# Patient Record
Sex: Male | Born: 1975
Health system: Southern US, Community
[De-identification: ages and names within clinical notes are randomized; demographics above are authoritative.]

## PROBLEM LIST (undated history)

## (undated) DIAGNOSIS — C801 Malignant (primary) neoplasm, unspecified: Secondary | ICD-10-CM

## (undated) DIAGNOSIS — T7840XA Allergy, unspecified, initial encounter: Secondary | ICD-10-CM

## (undated) HISTORY — PX: MYRINGOTOMY: SUR874

## (undated) HISTORY — DX: Allergy, unspecified, initial encounter: T78.40XA

## (undated) HISTORY — PX: INGUINAL HERNIA REPAIR: SHX194

## (undated) HISTORY — PX: TONSILLECTOMY: SUR1361

## (undated) HISTORY — DX: Malignant (primary) neoplasm, unspecified: C80.1

---

## 2006-04-05 ENCOUNTER — Emergency Department (HOSPITAL_COMMUNITY): Admission: EM | Admit: 2006-04-05 | Discharge: 2006-04-05 | Payer: Self-pay | Admitting: Family Medicine

## 2006-09-02 ENCOUNTER — Emergency Department (HOSPITAL_COMMUNITY): Admission: EM | Admit: 2006-09-02 | Discharge: 2006-09-02 | Payer: Self-pay | Admitting: Family Medicine

## 2007-01-06 ENCOUNTER — Emergency Department (HOSPITAL_COMMUNITY): Admission: EM | Admit: 2007-01-06 | Discharge: 2007-01-06 | Payer: Self-pay | Admitting: Emergency Medicine

## 2010-03-22 ENCOUNTER — Emergency Department (HOSPITAL_COMMUNITY)
Admission: EM | Admit: 2010-03-22 | Discharge: 2010-03-22 | Payer: Self-pay | Source: Home / Self Care | Admitting: Family Medicine

## 2010-03-30 ENCOUNTER — Emergency Department (HOSPITAL_COMMUNITY)
Admission: EM | Admit: 2010-03-30 | Discharge: 2010-03-30 | Payer: Self-pay | Source: Home / Self Care | Admitting: Family Medicine

## 2011-02-08 ENCOUNTER — Emergency Department (HOSPITAL_COMMUNITY)
Admission: EM | Admit: 2011-02-08 | Discharge: 2011-02-08 | Disposition: A | Discharge status: Home / Self Care | Payer: BC Managed Care – PPO | Source: Home / Self Care | Attending: Family Medicine | Admitting: Family Medicine

## 2011-02-08 DIAGNOSIS — J069 Acute upper respiratory infection, unspecified: Secondary | ICD-10-CM

## 2011-02-08 MED ORDER — GUAIFENESIN ER 600 MG PO TB12: 1200.0000 mg | ORAL_TABLET | Freq: Two times a day (BID) | ORAL | Status: AC

## 2011-02-08 MED ORDER — AZITHROMYCIN 250 MG PO TABS: 250.0000 mg | ORAL_TABLET | Freq: Every day | ORAL | Status: AC

## 2011-02-08 NOTE — ED Notes (Signed)
C/o fever, prod.  Cough of green sputum, runny/stuffy nose, body aches for 2 days.

## 2011-02-08 NOTE — ED Provider Notes (Signed)
History     CSN: [id number] Arrival date & time: 02/08/2011  8:14 AM   First MD Initiated Contact with Patient 02/08/11 [phone]      Chief Complaint  Patient presents with  . Fever  . Cough    (Consider location/radiation/quality/duration/timing/severity/associated sxs/prior treatment) Patient is a 35 y.o. male presenting with fever and cough. The history is provided by the patient.  Fever Primary symptoms of the febrile illness include fever and cough. Primary symptoms do not include wheezing. The current episode started 2 days ago. This is a new problem.  The fever has been unchanged since its onset. The maximum temperature recorded prior to his arrival was unknown.  The cough began 2 days ago. The cough is new. The cough is productive. The sputum is green.  Cough This is a new problem. The current episode started 2 days ago. The problem occurs constantly. The cough is productive of sputum. Pertinent negatives include no wheezing.  Green and thick.  History reviewed. No pertinent past medical history.  Past Surgical History  Procedure Date  . Inguinal hernia repair   . Tonsillectomy   . Myringotomy     History reviewed. No pertinent family history.  History  Substance Use Topics  . Smoking status: Never Smoker   . Smokeless tobacco: Not on file  . Alcohol Use: Yes     occasional      Review of Systems  Constitutional: Positive for fever.  HENT: Positive for congestion and postnasal drip.   Respiratory: Positive for cough. Negative for wheezing.   Cardiovascular: Negative.   Skin: Negative.     Allergies  Cough relief  Home Medications   Current Outpatient Rx  Name Route Sig Dispense Refill  . TYLENOL PO Oral Take by mouth as needed.      . AZITHROMYCIN 250 MG PO TABS Oral Take 1 tablet (250 mg total) by mouth daily. Take first 2 tablets together, then 1 every day until finished. 6 tablet 0  . GUAIFENESIN ER 600 MG PO TB12 Oral Take 2 tablets (1,200 mg  total) by mouth 2 (two) times daily. 28 tablet 0    BP 110/71  Pulse 120  Temp(Src) 98.4 F (36.9 C) (Oral)  Resp 18  SpO2 96%  Physical Exam  Constitutional: He appears well-developed and well-nourished. No distress.  HENT:  Head: Normocephalic and atraumatic.  Right Ear: External ear normal.  Left Ear: External ear normal.       Nasal congestion  Neck: Normal range of motion. Neck supple.  Cardiovascular: Normal rate and regular rhythm.   Pulmonary/Chest: Effort normal and breath sounds normal. He has no wheezes.  Lymphadenopathy:    He has no cervical adenopathy.  Skin: Skin is warm and dry.    ED Course  Procedures (including critical care time)  Labs Reviewed - No data to display No results found.   1. URI (upper respiratory infection)       MDM          Randa Spike, MD 02/08/11 [phone]

## 2012-12-22 ENCOUNTER — Telehealth: Payer: Self-pay | Admitting: Internal Medicine

## 2012-12-22 NOTE — Telephone Encounter (Signed)
Is that in [place]?? Thank you.

## 2012-12-22 NOTE — Telephone Encounter (Signed)
Pt is looking to est w/you as his PCP.  Your first new pt appmt is not until November 6th and he feels he needs to be seen sooner than that due to some left side facial weakness.  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 He will be available over the next couple of weeks on the following the days: 10/22 and 10/23 anytime before 11:00 a.m.; 12/26/2012 after 3:00 p.m.; 10/27 before noon; 10/28 before 11:00; 10/29, 10/30 10/31-anytime on these 3 days. Pt can see you either here at [facility] or at the [place] office to est w/you.

## 2012-12-22 NOTE — Telephone Encounter (Signed)
Put him on for 10/23

## 2012-12-22 NOTE — Telephone Encounter (Signed)
yes

## 2012-12-25 ENCOUNTER — Ambulatory Visit (INDEPENDENT_AMBULATORY_CARE_PROVIDER_SITE_OTHER): Payer: BC Managed Care – PPO | Admitting: Internal Medicine

## 2012-12-25 ENCOUNTER — Other Ambulatory Visit (INDEPENDENT_AMBULATORY_CARE_PROVIDER_SITE_OTHER): Payer: BC Managed Care – PPO

## 2012-12-25 ENCOUNTER — Encounter: Payer: Self-pay | Admitting: Internal Medicine

## 2012-12-25 ENCOUNTER — Ambulatory Visit (INDEPENDENT_AMBULATORY_CARE_PROVIDER_SITE_OTHER)
Admission: RE | Admit: 2012-12-25 | Discharge: 2012-12-25 | Disposition: A | Discharge status: Home / Self Care | Payer: BC Managed Care – PPO | Source: Ambulatory Visit | Attending: Internal Medicine | Admitting: Internal Medicine

## 2012-12-25 VITALS — BP 118/82 | HR 110 | Temp 98.6°F | Ht 70.0 in | Wt 203.5 lb

## 2012-12-25 DIAGNOSIS — Z0000 Encounter for general adult medical examination without abnormal findings: Secondary | ICD-10-CM

## 2012-12-25 DIAGNOSIS — M25512 Pain in left shoulder: Secondary | ICD-10-CM | POA: Insufficient documentation

## 2012-12-25 DIAGNOSIS — M25519 Pain in unspecified shoulder: Secondary | ICD-10-CM

## 2012-12-25 LAB — COMPREHENSIVE METABOLIC PANEL
AST: 18 U/L (ref 0–37)
Alkaline Phosphatase: 64 U/L (ref 39–117)
BUN: 13 mg/dL (ref 6–23)
Creatinine, Ser: 0.8 mg/dL (ref 0.4–1.5)
Potassium: 3.7 mEq/L (ref 3.5–5.1)

## 2012-12-25 LAB — CBC
Hemoglobin: 14.8 g/dL (ref 13.0–17.0)
MCHC: 33.8 g/dL (ref 30.0–36.0)
MCV: 83.3 fl (ref 78.0–100.0)
Platelets: 213 10*3/uL (ref 150.0–400.0)
RDW: 13.2 % (ref 11.5–14.6)

## 2012-12-25 LAB — LIPID PANEL
HDL: 55.8 mg/dL (ref >39.00)
LDL Cholesterol: 100 mg/dL — ABNORMAL HIGH (ref 0–99)
Total CHOL/HDL Ratio: 3
Triglycerides: 134 mg/dL (ref 0.0–149.0)

## 2012-12-25 NOTE — Patient Instructions (Signed)
Health Maintenance, Males A healthy lifestyle and preventative care can promote health and wellness.  Maintain regular health, dental, and eye exams.  Eat a healthy diet. Foods like vegetables, fruits, whole grains, low-fat dairy products, and lean protein foods contain the nutrients you need without too many calories. Decrease your intake of foods high in solid fats, added sugars, and salt. Get information about a proper diet from your caregiver, if necessary.  Regular physical exercise is one of the most important things you can do for your health. Most adults should get at least 150 minutes of moderate-intensity exercise (any activity that increases your heart rate and causes you to sweat) each week. In addition, most adults need muscle-strengthening exercises on 2 or more days a week.   Maintain a healthy weight. The body mass index (BMI) is a screening tool to identify possible weight problems. It provides an estimate of body fat based on height and weight. Your caregiver can help determine your BMI, and can help you achieve or maintain a healthy weight. For adults 20 years and older:  A BMI below 18.5 is considered underweight.  A BMI of 18.5 to 24.9 is normal.  A BMI of 25 to 29.9 is considered overweight.  A BMI of 30 and above is considered obese.  Maintain normal blood lipids and cholesterol by exercising and minimizing your intake of saturated fat. Eat a balanced diet with plenty of fruits and vegetables. Blood tests for lipids and cholesterol should begin at age 20 and be repeated every 5 years. If your lipid or cholesterol levels are high, you are over 50, or you are a high risk for heart disease, you may need your cholesterol levels checked more frequently.Ongoing high lipid and cholesterol levels should be treated with medicines, if diet and exercise are not effective.  If you smoke, find out from your caregiver how to quit. If you do not use tobacco, do not start.  If you  choose to drink alcohol, do not exceed 2 drinks per day. One drink is considered to be 12 ounces (355 mL) of beer, 5 ounces (148 mL) of wine, or 1.5 ounces (44 mL) of liquor.  Avoid use of street drugs. Do not share needles with anyone. Ask for help if you need support or instructions about stopping the use of drugs.  High blood pressure causes heart disease and increases the risk of stroke. Blood pressure should be checked at least every 1 to 2 years. Ongoing high blood pressure should be treated with medicines if weight loss and exercise are not effective.  If you are 45 to 36 years old, ask your caregiver if you should take aspirin to prevent heart disease.  Diabetes screening involves taking a blood sample to check your fasting blood sugar level. This should be done once every 3 years, after age 45, if you are within normal weight and without risk factors for diabetes. Testing should be considered at a younger age or be carried out more frequently if you are overweight and have at least 1 risk factor for diabetes.  Colorectal cancer can be detected and often prevented. Most routine colorectal cancer screening begins at the age of 50 and continues through age 75. However, your caregiver may recommend screening at an earlier age if you have risk factors for colon cancer. On a yearly basis, your caregiver may provide home test kits to check for hidden blood in the stool. Use of a small camera at the end of a tube,   to directly examine the colon (sigmoidoscopy or colonoscopy), can detect the earliest forms of colorectal cancer. Talk to your caregiver about this at age 50, when routine screening begins. Direct examination of the colon should be repeated every 5 to 10 years through age 75, unless early forms of pre-cancerous polyps or small growths are found.  Hepatitis C blood testing is recommended for all people born from 1945 through 1965 and any individual with known risks for hepatitis C.  Healthy  men should no longer receive prostate-specific antigen (PSA) blood tests as part of routine cancer screening. Consult with your caregiver about prostate cancer screening.  Testicular cancer screening is not recommended for adolescents or adult males who have no symptoms. Screening includes self-exam, caregiver exam, and other screening tests. Consult with your caregiver about any symptoms you have or any concerns you have about testicular cancer.  Practice safe sex. Use condoms and avoid high-risk sexual practices to reduce the spread of sexually transmitted infections (STIs).  Use sunscreen with a sun protection factor (SPF) of 30 or greater. Apply sunscreen liberally and repeatedly throughout the day. You should seek shade when your shadow is shorter than you. Protect yourself by wearing long sleeves, pants, a wide-brimmed hat, and sunglasses year round, whenever you are outdoors.  Notify your caregiver of new moles or changes in moles, especially if there is a change in shape or color. Also notify your caregiver if a mole is larger than the size of a pencil eraser.  A one-time screening for abdominal aortic aneurysm (AAA) and surgical repair of large AAAs by sound wave imaging (ultrasonography) is recommended for ages 65 to 75 years who are current or former smokers.  Stay current with your immunizations. Document Released: 08/18/2007 Document Revised: 05/14/2011 Document Reviewed: 07/17/2010 [employer] Patient Information 2014 [employer].  

## 2012-12-25 NOTE — Progress Notes (Signed)
HPI  Pt presents to the clinic today to establish care. He is transferring care from [facility]. He does have some concerns today about numbness on the left side of his face. This occurs every now and again. There is no pain associated with it. 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 He has been evaluated by ENT and was told that he could get a stent if he wanted one. He also c/o left shoulder pain that radiates down into his fingers.This has been going for more than a year. He denies any injury to his neck or his shoulder. He does not take anything OTC for the pain. He has never had it evaluated.  Flu: yearly 2014 Tetanus: 2013 Dentist: biannually   History reviewed. No pertinent past medical history.  No current outpatient prescriptions on file.   No current facility-administered medications for this visit.    Allergies  Allergen Reactions  . Dextromethorphan Hbr Hives    History reviewed. No pertinent family history.  History   Social History  . Marital Status: Married    Spouse Name: N/A    Number of Children: N/A  . Years of Education: N/A   Occupational History  . Not on file.   Social History Main Topics  . Smoking status: Never Smoker   . Smokeless tobacco: Not on file  . Alcohol Use: Yes     Comment: occasional  . Drug Use: No  . Sexual Activity: Not on file   Other Topics Concern  . Not on file   Social History Narrative  . No narrative on file    ROS:  Constitutional: Denies fever, malaise, fatigue, headache or abrupt weight changes.  HEENT: Denies eye pain, eye redness, ear pain, ringing in the ears, wax buildup, runny nose, nasal congestion, bloody nose, or sore throat. Respiratory: Denies difficulty breathing, shortness of breath, cough or sputum production.   Cardiovascular: Denies chest pain, chest tightness, palpitations or swelling in the hands or feet.  Gastrointestinal: Denies abdominal pain, bloating,  constipation, diarrhea or blood in the stool.  GU: Denies frequency, urgency, pain with urination, blood in urine, odor or discharge. Musculoskeletal: Pt reports left shoulder pain. Denies decrease in range of motion, difficulty with gait, muscle pain or joint swelling.  Skin: Denies redness, rashes, lesions or ulcercations.  Neurological: Pt reports left facial numbness. Denies dizziness, difficulty with memory, difficulty with speech or problems with balance and coordination.   No other specific complaints in a complete review of systems (except as listed in HPI above).  PE:  BP 118/82  Pulse 110  Temp(Src) 98.6 F (37 C) (Oral)  Ht 5\' 10"  (1.778 m)  Wt 203 lb 8 oz (92.307 kg)  BMI 29.2 kg/m2  SpO2 97% Wt Readings from Last 3 Encounters:  12/25/12 203 lb 8 oz (92.307 kg)    General: Appears his stated age, well developed, well nourished in NAD. HEENT: Head: normal shape and size; Eyes: sclera white, no icterus, conjunctiva pink, PERRLA and EOMs intact; Ears: Tm's gray and intact, normal light reflex; Nose: mucosa pink and moist, septum midline; Throat/Mouth: Teeth present, mucosa pink and moist, no lesions or ulcerations noted.  Neck: Normal range of motion. Neck supple, trachea midline. No massses, lumps or thyromegaly present.  Cardiovascular: Normal rate and rhythm. S1,S2 noted.  No murmur, rubs or gallops noted. No JVD or BLE edema. No carotid bruits noted. Pulmonary/Chest: Normal effort and positive vesicular breath sounds. No  respiratory distress. No wheezes, rales or ronchi noted.  Abdomen: Soft and nontender. Normal bowel sounds, no bruits noted. No distention or masses noted. Liver, spleen and kidneys non palpable. Musculoskeletal: Normal range of motion. No signs of joint swelling. No difficulty with gait. Negative drop can test. Neurological: Alert and oriented. Cranial nerves II-XII intact. Coordination normal. +DTRs bilaterally. Psychiatric: Mood and affect normal.  Behavior is normal. Judgment and thought content normal.      Assessment and Plan:  Prevent Health:  Will obtain screening labs today  RTC in 1 year or sooner if needed

## 2012-12-25 NOTE — Assessment & Plan Note (Signed)
?   Brachial plexus Will check xray May need to proceed with MRI Will call you with the findings and discuss further treatment plan

## 2012-12-29 ENCOUNTER — Telehealth: Payer: Self-pay

## 2012-12-29 NOTE — Telephone Encounter (Signed)
We will have to find out about the metal bar and contact him once we find out.

## 2012-12-29 NOTE — Telephone Encounter (Signed)
Patient called stating that he had been contacted about having an MRI done. Patient said that he was fine with having one done if the PCP thought it was a good idea, but he wasn't sure if he could have one done since he has a metal bar behind his teeth.   Please advise,   Thanks!

## 2013-01-05 NOTE — Telephone Encounter (Signed)
Pt wants to know if appropriate to have MRI since has metal in mouth.Please advise.

## 2013-01-05 NOTE — Telephone Encounter (Signed)
I talked with radiology. He will not be able to have the MRI done secondary to the metal bar at this time. If it continues to bother him, he can followup with Dr. Katrinka Blazing who can evaluate him using ultrasound

## 2013-01-08 ENCOUNTER — Other Ambulatory Visit: Payer: Self-pay | Admitting: Otolaryngology

## 2013-01-08 DIAGNOSIS — M26629 Arthralgia of temporomandibular joint, unspecified side: Secondary | ICD-10-CM

## 2013-01-15 ENCOUNTER — Ambulatory Visit
Admission: RE | Admit: 2013-01-15 | Discharge: 2013-01-15 | Disposition: A | Discharge status: Home / Self Care | Payer: BC Managed Care – PPO | Source: Ambulatory Visit | Attending: Otolaryngology | Admitting: Otolaryngology

## 2013-01-15 DIAGNOSIS — M26629 Arthralgia of temporomandibular joint, unspecified side: Secondary | ICD-10-CM

## 2013-01-15 MED ORDER — GADOBENATE DIMEGLUMINE 529 MG/ML IV SOLN
19.0000 mL | Freq: Once | INTRAVENOUS | Status: AC | PRN
  Administered 2013-01-15: 19 mL via INTRAVENOUS

## 2013-01-26 ENCOUNTER — Encounter: Payer: Self-pay | Admitting: Internal Medicine

## 2013-01-26 ENCOUNTER — Ambulatory Visit (INDEPENDENT_AMBULATORY_CARE_PROVIDER_SITE_OTHER): Payer: BC Managed Care – PPO | Admitting: Internal Medicine

## 2013-01-26 VITALS — BP 108/70 | HR 79 | Temp 98.1°F | Ht 70.0 in | Wt 200.5 lb

## 2013-01-26 DIAGNOSIS — M79605 Pain in left leg: Secondary | ICD-10-CM

## 2013-01-26 DIAGNOSIS — M79609 Pain in unspecified limb: Secondary | ICD-10-CM

## 2013-01-26 MED ORDER — PREDNISONE 10 MG PO TABS: ORAL_TABLET | ORAL | Status: DC

## 2013-01-26 NOTE — Progress Notes (Signed)
Subjective:    Patient ID: Mark Francis, male    DOB: 01-06-1976, 37 y.o.   MRN: [id number]  HPI  Pt presents to the clinic today with c/o left leg pain. This started on Saturday. He did work out in the yard on Saturday and is not sure if this pain is just due to being out of shape versus nerve inflammation which he has had in the past. He has not been taking anything OTC. He describes it as soreness. He has had some numbness and tingling. He denies any specific injury to the area.  Review of Systems  Past Medical History  Diagnosis Date  . Allergy     No current outpatient prescriptions on file.   No current facility-administered medications for this visit.    Allergies  Allergen Reactions  . Dextromethorphan Hbr Hives    Family History  Problem Relation Age of Onset  . Cancer Paternal Uncle     testicular  . Cancer Paternal Grandfather     lyphoma  . Diabetes Neg Hx   . Early death Neg Hx   . Hypertension Neg Hx   . Hyperlipidemia Neg Hx   . Stroke Neg Hx     History   Social History  . Marital Status: Married    Spouse Name: N/A    Number of Children: N/A  . Years of Education: N/A   Occupational History  . Not on file.   Social History Main Topics  . Smoking status: Never Smoker   . Smokeless tobacco: Not on file  . Alcohol Use: Yes     Comment: occasional  . Drug Use: No  . Sexual Activity: Yes   Other Topics Concern  . Not on file   Social History Narrative  . No narrative on file     Constitutional: Denies fever, malaise, fatigue, headache or abrupt weight changes.  Musculoskeletal: Denies decrease in range of motion, difficulty with gait, or joint pain and swelling.  Skin: Denies redness, rashes, lesions or ulcercations.  Neurological: Denies dizziness, difficulty with memory, difficulty with speech or problems with balance and coordination.   No other specific complaints in a complete review of systems (except as listed in HPI  above).     Objective:   Physical Exam   BP 108/70  Pulse 79  Temp(Src) 98.1 F (36.7 C) (Oral)  Ht 5\' 10"  (1.778 m)  Wt 200 lb 8 oz (90.946 kg)  BMI 28.77 kg/m2  SpO2 96% Wt Readings from Last 3 Encounters:  01/26/13 200 lb 8 oz (90.946 kg)  12/25/12 203 lb 8 oz (92.307 kg)    General: Appears his stated age, well developed, well nourished in NAD. Cardiovascular: Normal rate and rhythm. S1,S2 noted.  No murmur, rubs or gallops noted. No JVD or BLE edema. No carotid bruits noted. Pulmonary/Chest: Normal effort and positive vesicular breath sounds. No respiratory distress. No wheezes, rales or ronchi noted.  Musculoskeletal: Normal range of motion. No signs of joint swelling. No difficulty with gait.  Neurological: Alert and oriented. Cranial nerves II-XII intact. Coordination normal. +DTRs bilaterally.  BMET    Component Value Date/Time   NA 138 12/25/2012 1512   K 3.7 12/25/2012 1512   CL 101 12/25/2012 1512   CO2 28 12/25/2012 1512   GLUCOSE 135* 12/25/2012 1512   BUN 13 12/25/2012 1512   CREATININE 0.8 12/25/2012 1512   CALCIUM 9.5 12/25/2012 1512    Lipid Panel     Component Value Date/Time  CHOL 183 12/25/2012 1512   TRIG 134.0 12/25/2012 1512   HDL 55.80 12/25/2012 1512   CHOLHDL 3 12/25/2012 1512   VLDL 26.8 12/25/2012 1512   LDLCALC 100* 12/25/2012 1512    CBC    Component Value Date/Time   WBC 9.8 12/25/2012 1512   RBC 5.24 12/25/2012 1512   HGB 14.8 12/25/2012 1512   HCT 43.6 12/25/2012 1512   PLT 213.0 12/25/2012 1512   MCV 83.3 12/25/2012 1512   MCHC 33.8 12/25/2012 1512   RDW 13.2 12/25/2012 1512    Hgb A1C No results found for this basename: HGBA1C        Assessment & Plan:   Left leg pain, probably MSK strain versus nerve inflammation:  ERX for pred taper  Stretching exercises given Call me after you finish your medication and let me know if there is no improvement  RTC as needed

## 2013-01-26 NOTE — Progress Notes (Signed)
Pre-visit discussion using our clinic review tool. No additional management support is needed unless otherwise documented below in the visit note.  

## 2013-02-02 ENCOUNTER — Other Ambulatory Visit: Payer: Self-pay | Admitting: Family Medicine

## 2013-02-05 ENCOUNTER — Telehealth: Payer: Self-pay

## 2013-02-05 NOTE — Telephone Encounter (Signed)
Pt called re: pain in lt side of face, lt arm and leg; pt has finished prednisone; pt said he is 90% better and when pt started prednisone symptoms went away; when pt started one pill a day pt had itch on lt side of face; but when finished prednisone itching in face has gone. Pt taking ibuprofen and doing a lot better, nerve pain is virtually gone. [facility]. Pt said he can wait on cb from [facility] NP(pt knows Rene Kocher returns next week)  Advised pt if condition changes or worsens prior to cb for pt to call office.Please advise.

## 2013-02-05 NOTE — Telephone Encounter (Signed)
Pt left v/m;pt was seen 01/26/13 and was to cb after finishes prednisone with update on leg pain. Left v/m for pt to cb.

## 2013-02-05 NOTE — Telephone Encounter (Signed)
Pt improved with current treatment course. Continue as discussed. Will route to PCP.

## 2013-02-09 ENCOUNTER — Ambulatory Visit: Payer: BC Managed Care – PPO | Admitting: Internal Medicine

## 2013-09-24 ENCOUNTER — Encounter: Payer: Self-pay | Admitting: Internal Medicine

## 2013-09-24 ENCOUNTER — Ambulatory Visit (INDEPENDENT_AMBULATORY_CARE_PROVIDER_SITE_OTHER): Payer: BC Managed Care – PPO | Admitting: Internal Medicine

## 2013-09-24 VITALS — BP 122/66 | HR 68 | Temp 98.2°F | Wt 205.8 lb

## 2013-09-24 DIAGNOSIS — M545 Low back pain, unspecified: Secondary | ICD-10-CM

## 2013-09-24 DIAGNOSIS — K648 Other hemorrhoids: Secondary | ICD-10-CM

## 2013-09-24 NOTE — Progress Notes (Signed)
Pre visit review using our clinic review tool, if applicable. No additional management support is needed unless otherwise documented below in the visit note. 

## 2013-09-24 NOTE — Progress Notes (Signed)
Subjective:    Patient ID: Mark Francis, male    DOB: 1975-12-20, 38 y.o.   MRN: [id number]  HPI  Pt presents to the clinic today with c/o lower back pain. He reports this started 2-3 weeks ago.  He describes the pain as sore and achy. The pain does radiate into his left buttock. It is intermittent. He denies any specific injury to the area. He has not tried anything OTC. He does report that he sits a lot at work.  Additionally, he reports that he has noticed BRB when he wipes. He noticed this over the last few weeks. He has noticed some rectal pain. It is also intermittent. He reports that he is not constipated but he does strain at times. He has no history of hemorrhoids that he is aware of. He denies rectal trauma. He has not tried anything OTC.  Review of Systems      Past Medical History  Diagnosis Date  . Allergy     No current outpatient prescriptions on file.   No current facility-administered medications for this visit.    Allergies  Allergen Reactions  . Dextromethorphan Hbr Hives    Family History  Problem Relation Age of Onset  . Cancer Paternal Uncle     testicular  . Cancer Paternal Grandfather     lyphoma  . Diabetes Neg Hx   . Early death Neg Hx   . Hypertension Neg Hx   . Hyperlipidemia Neg Hx   . Stroke Neg Hx     History   Social History  . Marital Status: Married    Spouse Name: N/A    Number of Children: N/A  . Years of Education: N/A   Occupational History  . Not on file.   Social History Main Topics  . Smoking status: Never Smoker   . Smokeless tobacco: Not on file  . Alcohol Use: Yes     Comment: occasional  . Drug Use: No  . Sexual Activity: Yes   Other Topics Concern  . Not on file   Social History Narrative  . No narrative on file     Constitutional: Denies fever, malaise, fatigue, headache or abrupt weight changes.  Gastrointestinal: Pt reports blood in stool. Denies abdominal pain, bloating, constipation, diarrhea.   GU: Denies urgency, frequency, pain with urination, burning sensation, blood in urine, odor or discharge. Musculoskeletal: Pt reports low back pain. Denies decrease in range of motion, difficulty with gait, or joint pain and swelling.    No other specific complaints in a complete review of systems (except as listed in HPI above).  Objective:   Physical Exam  BP 122/66  Pulse 68  Temp(Src) 98.2 F (36.8 C) (Oral)  Wt 205 lb 12 oz (93.328 kg)  SpO2 98% Wt Readings from Last 3 Encounters:  09/24/13 205 lb 12 oz (93.328 kg)  01/26/13 200 lb 8 oz (90.946 kg)  12/25/12 203 lb 8 oz (92.307 kg)    General: Appears his stated age, well developed, well nourished in NAD. Cardiovascular: Normal rate and rhythm. S1,S2 noted.  No murmur, rubs or gallops noted. No JVD or BLE edema. No carotid bruits noted. Pulmonary/Chest: Normal effort and positive vesicular breath sounds. No respiratory distress. No wheezes, rales or ronchi noted.  Abdomen: Soft and nontender. Normal bowel sounds, no bruits noted. No distention or masses noted. Liver, spleen and kidneys non palpable. Rectal: No external hemorrhoids noted. Normal rectal tone. A few internal hemorrhoids were noted. No fissure  or blood noted. Musculoskeletal: Normal flexion, extension and rotation of the lumbar spine. No pain with palpation. Strength 5/5 BLE. No difficulty with gait.    BMET    Component Value Date/Time   NA 138 12/25/2012 1512   K 3.7 12/25/2012 1512   CL 101 12/25/2012 1512   CO2 28 12/25/2012 1512   GLUCOSE 135* 12/25/2012 1512   BUN 13 12/25/2012 1512   CREATININE 0.8 12/25/2012 1512   CALCIUM 9.5 12/25/2012 1512    Lipid Panel     Component Value Date/Time   CHOL 183 12/25/2012 1512   TRIG 134.0 12/25/2012 1512   HDL 55.80 12/25/2012 1512   CHOLHDL 3 12/25/2012 1512   VLDL 26.8 12/25/2012 1512   LDLCALC 100* 12/25/2012 1512    CBC    Component Value Date/Time   WBC 9.8 12/25/2012 1512   RBC 5.24  12/25/2012 1512   HGB 14.8 12/25/2012 1512   HCT 43.6 12/25/2012 1512   PLT 213.0 12/25/2012 1512   MCV 83.3 12/25/2012 1512   MCHC 33.8 12/25/2012 1512   RDW 13.2 12/25/2012 1512    Hgb A1C No results found for this basename: HGBA1C         Assessment & Plan:   Low back pain:  Like strain from sitting for long periods of time Advised him to stand up once per hour to stretch and walk in place Ok to take occasional ibuprofen if needed  Internal Hemorrhoids:  Not thrombosed or currently bleeding He denies RX for hydrocortisone suppositories Advised him to drink plenty of fluids to avoid constipation Advised him not to strain with BM  RTC as needed

## 2013-09-24 NOTE — Patient Instructions (Signed)

## 2013-10-14 ENCOUNTER — Ambulatory Visit (INDEPENDENT_AMBULATORY_CARE_PROVIDER_SITE_OTHER): Payer: BC Managed Care – PPO | Admitting: Internal Medicine

## 2013-10-14 ENCOUNTER — Encounter: Payer: Self-pay | Admitting: Internal Medicine

## 2013-10-14 VITALS — BP 120/68 | HR 65 | Temp 98.0°F | Wt 206.2 lb

## 2013-10-14 DIAGNOSIS — J0100 Acute maxillary sinusitis, unspecified: Secondary | ICD-10-CM

## 2013-10-14 MED ORDER — AMOXICILLIN 875 MG PO TABS: 875.0000 mg | ORAL_TABLET | Freq: Two times a day (BID) | ORAL | Status: DC

## 2013-10-14 NOTE — Patient Instructions (Addendum)

## 2013-10-14 NOTE — Progress Notes (Signed)
HPI  Pt presents to the clinic today with c/o headache, nasal congestion, facial pain and pressure and cough. This started 2 weeks ago. The cough is productive of yellow mucous. He has chills, but denies fever or body aches. He reports that he has tried Sudafed and vicks vapor rub. He does have a history of allergies. He has not had sick contacts. He does not smoke.  Review of Systems    Past Medical History  Diagnosis Date  . Allergy     Family History  Problem Relation Age of Onset  . Cancer Paternal Uncle     testicular  . Cancer Paternal Grandfather     lyphoma  . Diabetes Neg Hx   . Early death Neg Hx   . Hypertension Neg Hx   . Hyperlipidemia Neg Hx   . Stroke Neg Hx     History   Social History  . Marital Status: Married    Spouse Name: N/A    Number of Children: N/A  . Years of Education: N/A   Occupational History  . Not on file.   Social History Main Topics  . Smoking status: Never Smoker   . Smokeless tobacco: Not on file  . Alcohol Use: Yes     Comment: occasional  . Drug Use: No  . Sexual Activity: Yes   Other Topics Concern  . Not on file   Social History Narrative  . No narrative on file    Allergies  Allergen Reactions  . Benzonatate Hives  . Dextromethorphan Hbr Hives     Constitutional: Positive headache, fatigue. Denies fever or abrupt weight changes.  HEENT:  Positive facial pain, nasal congestion. Denies eye redness, ear pain, ringing in the ears, wax buildup, runny nose or bloody nose. Respiratory: Positive cough. Denies difficulty breathing or shortness of breath.  Cardiovascular: Denies chest pain, chest tightness, palpitations or swelling in the hands or feet.   No other specific complaints in a complete review of systems (except as listed in HPI above).  Objective:  BP 120/68  Pulse 65  Temp(Src) 98 F (36.7 C) (Oral)  Wt 206 lb 4 oz (93.554 kg)  SpO2 98%   General: Appears his stated age, well developed, well  nourished in NAD. HEENT: Head: normal shape and size, maxillary sinus tenderness noted; Eyes: sclera white, no icterus, conjunctiva pink, PERRLA and EOMs intact; Ears: Tm's gray and intact, normal light reflex; Nose: mucosa pink and moist, septum midline; Throat/Mouth: + PND. Teeth present, mucosa pink and moist, no exudate noted, no lesions or ulcerations noted.  Cardiovascular: Normal rate and rhythm. S1,S2 noted.  No murmur, rubs or gallops noted. No JVD or BLE edema. No carotid bruits noted. Pulmonary/Chest: Normal effort and positive vesicular breath sounds. No respiratory distress. No wheezes, rales or ronchi noted.      Assessment & Plan:   Acute bacterial sinusitis  Can use a Neti Pot which can be purchased from your local drug store. Flonase 2 sprays each nostril for 3 days and then as needed. Amoxil BID for 10 days  RTC as needed or if symptoms persist.

## 2013-10-14 NOTE — Progress Notes (Signed)
Pre visit review using our clinic review tool, if applicable. No additional management support is needed unless otherwise documented below in the visit note. 

## 2015-03-03 ENCOUNTER — Ambulatory Visit (INDEPENDENT_AMBULATORY_CARE_PROVIDER_SITE_OTHER): Payer: BLUE CROSS/BLUE SHIELD | Admitting: Internal Medicine

## 2015-03-03 ENCOUNTER — Encounter: Payer: Self-pay | Admitting: Internal Medicine

## 2015-03-03 VITALS — BP 116/74 | HR 76 | Temp 98.1°F | Wt 206.0 lb

## 2015-03-03 DIAGNOSIS — J0100 Acute maxillary sinusitis, unspecified: Secondary | ICD-10-CM | POA: Diagnosis not present

## 2015-03-03 MED ORDER — DOXYCYCLINE HYCLATE 100 MG PO TABS: 100.0000 mg | ORAL_TABLET | Freq: Two times a day (BID) | ORAL | Status: DC

## 2015-03-03 NOTE — Progress Notes (Signed)
HPI  Pt presents to the clinic today with c/o headache, facial pressure, nasal congestion, ear fullness, sore throat and cough. This started 2 -3 weeks ago. He is blowing green/Garringer mucous out of her nose. He has run low grade fevers but denies chills or body aches. He has tried Copywriter, advertising cold and sinus with minimal relief. He does have a history of seasonal allergies, but does not take anything OTC for it. He has not had sick contacts that he is aware of.  Review of Systems    Past Medical History  Diagnosis Date  . Allergy     Family History  Problem Relation Age of Onset  . Cancer Paternal Uncle     testicular  . Cancer Paternal Grandfather     lyphoma  . Diabetes Neg Hx   . Early death Neg Hx   . Hypertension Neg Hx   . Hyperlipidemia Neg Hx   . Stroke Neg Hx     Social History   Social History  . Marital Status: Married    Spouse Name: N/A  . Number of Children: N/A  . Years of Education: N/A   Occupational History  . Not on file.   Social History Main Topics  . Smoking status: Never Smoker   . Smokeless tobacco: Not on file  . Alcohol Use: Yes     Comment: occasional  . Drug Use: No  . Sexual Activity: Yes   Other Topics Concern  . Not on file   Social History Narrative    Allergies  Allergen Reactions  . Benzonatate Hives  . Dextromethorphan Hbr Hives     Constitutional: Positive headache, fatigue and fever. Denies abrupt weight changes.  HEENT:  Positive facial pain, nasal congestion and sore throat. Denies eye redness, ear pain, ringing in the ears, wax buildup, runny nose or bloody nose. Respiratory: Positive cough. Denies difficulty breathing or shortness of breath.  Cardiovascular: Denies chest pain, chest tightness, palpitations or swelling in the hands or feet.   No other specific complaints in a complete review of systems (except as listed in HPI above).  Objective:  BP 116/74 mmHg  Pulse 76  Temp(Src) 98.1 F (36.7 C) (Oral)  Wt  206 lb (93.441 kg)  SpO2 98%   General: Appears his stated age, well developed, well nourished in NAD. HEENT: Head: normal shape and size, maxillary sinus tenderness noted; Eyes: sclera white, no icterus, conjunctiva pink; Ears: Tm's gray and intact, normal light reflex; Nose: mucosa boggy and moist, septum midline; Throat/Mouth: + PND. Teeth present, mucosa erythematous and moist, no exudate noted, no lesions or ulcerations noted.  Neck:  No adenopathy noted.  Cardiovascular: Normal rate and rhythm. S1,S2 noted.  No murmur, rubs or gallops noted.  Pulmonary/Chest: Normal effort and positive vesicular breath sounds. No respiratory distress. No wheezes, rales or ronchi noted.      Assessment & Plan:   Acute bacterial sinusitis  Can use a Neti Pot which can be purchased from your local drug store. Flonase 2 sprays each nostril for 3 days and then as needed. Doxyxcycline BID for 10 days  RTC as needed or if symptoms persist.

## 2015-03-03 NOTE — Patient Instructions (Signed)

## 2015-03-03 NOTE — Progress Notes (Signed)
Pre visit review using our clinic review tool, if applicable. No additional management support is needed unless otherwise documented below in the visit note. 

## 2015-05-20 ENCOUNTER — Ambulatory Visit (INDEPENDENT_AMBULATORY_CARE_PROVIDER_SITE_OTHER): Payer: BLUE CROSS/BLUE SHIELD | Admitting: Primary Care

## 2015-05-20 ENCOUNTER — Encounter: Payer: Self-pay | Admitting: Primary Care

## 2015-05-20 VITALS — BP 144/90 | HR 102 | Temp 98.5°F | Wt 208.8 lb

## 2015-05-20 DIAGNOSIS — J019 Acute sinusitis, unspecified: Secondary | ICD-10-CM

## 2015-05-20 DIAGNOSIS — B9689 Other specified bacterial agents as the cause of diseases classified elsewhere: Secondary | ICD-10-CM

## 2015-05-20 MED ORDER — AMOXICILLIN-POT CLAVULANATE 875-125 MG PO TABS: 1.0000 | ORAL_TABLET | Freq: Two times a day (BID) | ORAL | Status: DC

## 2015-05-20 NOTE — Patient Instructions (Signed)
Start Augmentin antibiotics. Take 1 tablet by mouth twice daily for 10 days.  Continue Flonase for sinus pressure and congestion and Dayquil/Nyquil as needed for cough.  Increase consumption of fluids and rest.  It was a pleasure meeting you!  Sinusitis, Adult Sinusitis is redness, soreness, and inflammation of the paranasal sinuses. Paranasal sinuses are air pockets within the bones of your face. They are located beneath your eyes, in the middle of your forehead, and above your eyes. In healthy paranasal sinuses, mucus is able to drain out, and air is able to circulate through them by way of your nose. However, when your paranasal sinuses are inflamed, mucus and air can become trapped. This can allow bacteria and other germs to grow and cause infection. Sinusitis can develop quickly and last only a short time (acute) or continue over a long period (chronic). Sinusitis that lasts for more than 12 weeks is considered chronic. CAUSES Causes of sinusitis include:  Allergies.  Structural abnormalities, such as displacement of the cartilage that separates your nostrils (deviated septum), which can decrease the air flow through your nose and sinuses and affect sinus drainage.  Functional abnormalities, such as when the small hairs (cilia) that line your sinuses and help remove mucus do not work properly or are not present. SIGNS AND SYMPTOMS Symptoms of acute and chronic sinusitis are the same. The primary symptoms are pain and pressure around the affected sinuses. Other symptoms include:  Upper toothache.  Earache.  Headache.  Bad breath.  Decreased sense of smell and taste.  A cough, which worsens when you are lying flat.  Fatigue.  Fever.  Thick drainage from your nose, which often is green and may contain pus (purulent).  Swelling and warmth over the affected sinuses. DIAGNOSIS Your health care provider will perform a physical exam. During your exam, your health care provider  may perform any of the following to help determine if you have acute sinusitis or chronic sinusitis:  Look in your nose for signs of abnormal growths in your nostrils (nasal polyps).  Tap over the affected sinus to check for signs of infection.  View the inside of your sinuses using an imaging device that has a light attached (endoscope). If your health care provider suspects that you have chronic sinusitis, one or more of the following tests may be recommended:  Allergy tests.  Nasal culture. A sample of mucus is taken from your nose, sent to a lab, and screened for bacteria.  Nasal cytology. A sample of mucus is taken from your nose and examined by your health care provider to determine if your sinusitis is related to an allergy. TREATMENT Most cases of acute sinusitis are related to a viral infection and will resolve on their own within 10 days. Sometimes, medicines are prescribed to help relieve symptoms of both acute and chronic sinusitis. These may include pain medicines, decongestants, nasal steroid sprays, or saline sprays. However, for sinusitis related to a bacterial infection, your health care provider will prescribe antibiotic medicines. These are medicines that will help kill the bacteria causing the infection. Rarely, sinusitis is caused by a fungal infection. In these cases, your health care provider will prescribe antifungal medicine. For some cases of chronic sinusitis, surgery is needed. Generally, these are cases in which sinusitis recurs more than 3 times per year, despite other treatments. HOME CARE INSTRUCTIONS  Drink plenty of water. Water helps thin the mucus so your sinuses can drain more easily.  Use a humidifier.  Inhale steam  3-4 times a day (for example, sit in the bathroom with the shower running).  Apply a warm, moist washcloth to your face 3-4 times a day, or as directed by your health care provider.  Use saline nasal sprays to help moisten and clean your  sinuses.  Take medicines only as directed by your health care provider.  If you were prescribed either an antibiotic or antifungal medicine, finish it all even if you start to feel better. SEEK IMMEDIATE MEDICAL CARE IF:  You have increasing pain or severe headaches.  You have nausea, vomiting, or drowsiness.  You have swelling around your face.  You have vision problems.  You have a stiff neck.  You have difficulty breathing.   This information is not intended to replace advice given to you by your health care provider. Make sure you discuss any questions you have with your health care provider.   Document Released: 02/19/2005 Document Revised: 03/12/2014 Document Reviewed: 03/06/2011 Elsevier Interactive Patient Education [employer].

## 2015-05-20 NOTE — Progress Notes (Signed)
   Subjective:    Patient ID: Mark Francis, male    DOB: [birth year], 40 y.o.   MRN: [id number]  HPI  Mark Francis is a 40 year old male who presents today with a chief complaint of nasal congestion. He also reports sinus pressure, sore throat, and cough. His symptoms have been present since Wednesday last week. His sinus pressure became worse Wednesday this week (1 week later) as he's now blowing green and yellow mucous from his nasal cavity. He's taken Dayquil, Nyquil, alka-selzer cold and flu, sudafed, and ibuprofen/tylenol with temporary improvement.   Review of Systems  Constitutional: Positive for chills and fatigue. Negative for fever.  HENT: Positive for congestion, sinus pressure and sore throat.   Respiratory: Positive for cough. Negative for shortness of breath.   Cardiovascular: Negative for chest pain.       Past Medical History  Diagnosis Date  . Allergy     Social History   Social History  . Marital Status: Married    Spouse Name: N/A  . Number of Children: N/A  . Years of Education: N/A   Occupational History  . Not on file.   Social History Main Topics  . Smoking status: Never Smoker   . Smokeless tobacco: Not on file  . Alcohol Use: Yes     Comment: occasional  . Drug Use: No  . Sexual Activity: Yes   Other Topics Concern  . Not on file   Social History Narrative    Past Surgical History  Procedure Laterality Date  . Inguinal hernia repair    . Tonsillectomy    . Myringotomy      Family History  Problem Relation Age of Onset  . Cancer Paternal Uncle     testicular  . Cancer Paternal Grandfather     lyphoma  . Diabetes Neg Hx   . Early death Neg Hx   . Hypertension Neg Hx   . Hyperlipidemia Neg Hx   . Stroke Neg Hx     Allergies  Allergen Reactions  . Benzonatate Hives  . Dextromethorphan Hbr Hives    No current outpatient prescriptions on file prior to visit.   No current facility-administered medications on file prior to visit.     BP 144/90 mmHg  Pulse 102  Temp(Src) 98.5 F (36.9 C) (Oral)  Wt 208 lb 12.8 oz (94.711 kg)  SpO2 98%    Objective:   Physical Exam  Constitutional: He appears well-nourished.  HENT:  Right Ear: Tympanic membrane and ear canal normal.  Left Ear: Tympanic membrane and ear canal normal.  Nose: Right sinus exhibits maxillary sinus tenderness and frontal sinus tenderness. Left sinus exhibits maxillary sinus tenderness and frontal sinus tenderness.  Mouth/Throat: Oropharynx is clear and moist.  Mild swelling to area of left maxillary sinus cavity.   Eyes: Conjunctivae are normal.  Neck: Neck supple.  Cardiovascular: Normal rate and regular rhythm.   Pulmonary/Chest: Effort normal and breath sounds normal. He has no wheezes. He has no rales.  Skin: Skin is warm and dry.          Assessment & Plan:  Acute Bacterial Sinusitis:  Symptoms x 10 days, worse after 1 week. Now with green mucous from nasal cavity and feeling much worse. Exam with mild swelling to area of left maxillary sinus. Moderate tenderness to maxillary sinuses.  Start Augmentin BID 10 day course. Continue flonase. Fluids, rest, return precautions provided.

## 2015-05-20 NOTE — Progress Notes (Signed)
Pre visit review using our clinic review tool, if applicable. No additional management support is needed unless otherwise documented below in the visit note. 

## 2015-10-06 ENCOUNTER — Encounter: Payer: Self-pay | Admitting: Internal Medicine

## 2015-10-06 ENCOUNTER — Ambulatory Visit (INDEPENDENT_AMBULATORY_CARE_PROVIDER_SITE_OTHER): Payer: BLUE CROSS/BLUE SHIELD | Admitting: Internal Medicine

## 2015-10-06 VITALS — BP 126/76 | HR 82 | Temp 98.2°F | Wt 207.5 lb

## 2015-10-06 DIAGNOSIS — L02419 Cutaneous abscess of limb, unspecified: Secondary | ICD-10-CM

## 2015-10-06 DIAGNOSIS — R599 Enlarged lymph nodes, unspecified: Secondary | ICD-10-CM

## 2015-10-06 DIAGNOSIS — R590 Localized enlarged lymph nodes: Secondary | ICD-10-CM

## 2015-10-06 NOTE — Progress Notes (Signed)
Subjective:    Patient ID: Mark Francis, male    DOB: 05/20/1975, 40 y.o.   MRN: [id number]  HPI  Pt presents to the clinic today with c/o a bump of his upper right thigh. He noticed this 4 days ago. The bump is purple. It has been tender but he denies redness or warmth. He tried to pop open the bump, but only blood came out. He also also noticed some tender bumps in his right groin. He thinks they are lymph nodes. He denies fever, chills or body aches. He has cleansed the area with Hydrogen Peroxide.  Review of Systems      Past Medical History:  Diagnosis Date  . Allergy     No current outpatient prescriptions on file.   No current facility-administered medications for this visit.     Allergies  Allergen Reactions  . Benzonatate Hives  . Dextromethorphan Hbr Hives    Family History  Problem Relation Age of Onset  . Cancer Paternal Uncle     testicular  . Cancer Paternal Grandfather     lyphoma  . Diabetes Neg Hx   . Early death Neg Hx   . Hypertension Neg Hx   . Hyperlipidemia Neg Hx   . Stroke Neg Hx     Social History   Social History  . Marital status: Married    Spouse name: N/A  . Number of children: N/A  . Years of education: N/A   Occupational History  . Not on file.   Social History Main Topics  . Smoking status: Never Smoker  . Smokeless tobacco: Not on file  . Alcohol use Yes     Comment: occasional  . Drug use: No  . Sexual activity: Yes   Other Topics Concern  . Not on file   Social History Narrative  . No narrative on file     Constitutional: Denies fever, malaise, fatigue, headache or abrupt weight changes.  Skin: Pt reports bump of right thigh. Denies redness, rashes, or ulcercations.    No other specific complaints in a complete review of systems (except as listed in HPI above).  Objective:   Physical Exam  BP 126/76   Pulse 82   Temp 98.2 F (36.8 C) (Oral)   Wt 207 lb 8 oz (94.1 kg)   SpO2 98%   BMI 29.77 kg/m   Wt Readings from Last 3 Encounters:  10/06/15 207 lb 8 oz (94.1 kg)  05/20/15 208 lb 12.8 oz (94.7 kg)  03/03/15 206 lb (93.4 kg)    General: Appears his stated age, well developed, well nourished in NAD. Skin: Small scab to right upper thigh, no redness, drainage or warmth noted. 2 mobile, mildly tender lymph nodes noted in the right groin. Cardiovascular: Normal rate and rhythm. S1,S2 noted.  No murmur, rubs or gallops noted.  Pulmonary/Chest: Normal effort and positive vesicular breath sounds. No respiratory distress. No wheezes, rales or ronchi noted.    BMET    Component Value Date/Time   NA 138 12/25/2012 1512   K 3.7 12/25/2012 1512   CL 101 12/25/2012 1512   CO2 28 12/25/2012 1512   GLUCOSE 135 (H) 12/25/2012 1512   BUN 13 12/25/2012 1512   CREATININE 0.8 12/25/2012 1512   CALCIUM 9.5 12/25/2012 1512    Lipid Panel     Component Value Date/Time   CHOL 183 12/25/2012 1512   TRIG 134.0 12/25/2012 1512   HDL 55.80 12/25/2012 1512   CHOLHDL 3  12/25/2012 1512   VLDL 26.8 12/25/2012 1512   LDLCALC 100 (H) 12/25/2012 1512    CBC    Component Value Date/Time   WBC 9.8 12/25/2012 1512   RBC 5.24 12/25/2012 1512   HGB 14.8 12/25/2012 1512   HCT 43.6 12/25/2012 1512   PLT 213.0 12/25/2012 1512   MCV 83.3 12/25/2012 1512   MCHC 33.8 12/25/2012 1512   RDW 13.2 12/25/2012 1512    Hgb A1C No results found for: HGBA1C          Assessment & Plan:   Abscess of right thigh, with inguinal lymphadenopathy:  Appears to be resolving No indication for abx Ibuprofen as needed  Return precautions given, RTC as needed or if symptoms persist or worsen Rissie Sculley, NP

## 2015-10-06 NOTE — Progress Notes (Signed)
Pre visit review using our clinic review tool, if applicable. No additional management support is needed unless otherwise documented below in the visit note. 

## 2015-10-06 NOTE — Patient Instructions (Signed)

## 2015-10-25 DIAGNOSIS — D2262 Melanocytic nevi of left upper limb, including shoulder: Secondary | ICD-10-CM | POA: Diagnosis not present

## 2015-10-25 DIAGNOSIS — D2261 Melanocytic nevi of right upper limb, including shoulder: Secondary | ICD-10-CM | POA: Diagnosis not present

## 2015-10-25 DIAGNOSIS — D485 Neoplasm of uncertain behavior of skin: Secondary | ICD-10-CM | POA: Diagnosis not present

## 2015-10-25 DIAGNOSIS — Z85828 Personal history of other malignant neoplasm of skin: Secondary | ICD-10-CM | POA: Diagnosis not present

## 2015-10-25 DIAGNOSIS — D225 Melanocytic nevi of trunk: Secondary | ICD-10-CM | POA: Diagnosis not present

## 2015-12-09 ENCOUNTER — Ambulatory Visit (INDEPENDENT_AMBULATORY_CARE_PROVIDER_SITE_OTHER): Payer: BLUE CROSS/BLUE SHIELD | Admitting: Family Medicine

## 2015-12-09 ENCOUNTER — Encounter: Payer: Self-pay | Admitting: Family Medicine

## 2015-12-09 VITALS — BP 104/74 | HR 82 | Temp 98.1°F | Wt 201.5 lb

## 2015-12-09 DIAGNOSIS — R1011 Right upper quadrant pain: Secondary | ICD-10-CM

## 2015-12-09 LAB — CBC WITH DIFFERENTIAL/PLATELET
BASOS ABS: 71 {cells}/uL (ref 0–200)
BASOS PCT: 1 %
EOS ABS: 142 {cells}/uL (ref 15–500)
EOS PCT: 2 %
HCT: 41.7 % (ref 38.5–50.0)
HEMOGLOBIN: 13.7 g/dL (ref 13.2–17.1)
LYMPHS ABS: 1988 {cells}/uL (ref 850–3900)
Lymphocytes Relative: 28 %
MCH: 27.5 pg (ref 27.0–33.0)
MCHC: 32.9 g/dL (ref 32.0–36.0)
MCV: 83.6 fL (ref 80.0–100.0)
MPV: 10.8 fL (ref 7.5–12.5)
Monocytes Absolute: 497 cells/uL (ref 200–950)
Monocytes Relative: 7 %
NEUTROS ABS: 4402 {cells}/uL (ref 1500–7800)
Neutrophils Relative %: 62 %
Platelets: 218 10*3/uL (ref 140–400)
RBC: 4.99 MIL/uL (ref 4.20–5.80)
RDW: 13.6 % (ref 11.0–15.0)
WBC: 7.1 10*3/uL (ref 3.8–10.8)

## 2015-12-09 LAB — COMPREHENSIVE METABOLIC PANEL
ALBUMIN: 4.2 g/dL (ref 3.6–5.1)
ALT: 12 U/L (ref 9–46)
AST: 16 U/L (ref 10–40)
Alkaline Phosphatase: 57 U/L (ref 40–115)
BUN: 13 mg/dL (ref 7–25)
CHLORIDE: 102 mmol/L (ref 98–110)
CO2: 26 mmol/L (ref 20–31)
CREATININE: 0.91 mg/dL (ref 0.60–1.35)
Calcium: 9.1 mg/dL (ref 8.6–10.3)
Glucose, Bld: 119 mg/dL — ABNORMAL HIGH (ref 65–99)
Potassium: 4 mmol/L (ref 3.5–5.3)
SODIUM: 139 mmol/L (ref 135–146)
TOTAL PROTEIN: 6.6 g/dL (ref 6.1–8.1)
Total Bilirubin: 0.5 mg/dL (ref 0.2–1.2)

## 2015-12-09 LAB — LIPASE: LIPASE: 21 U/L (ref 7–60)

## 2015-12-09 MED ORDER — RANITIDINE HCL 150 MG PO TABS: 150.0000 mg | ORAL_TABLET | Freq: Two times a day (BID) | ORAL | Status: DC

## 2015-12-09 NOTE — Progress Notes (Signed)
Pre visit review using our clinic review tool, if applicable. No additional management support is needed unless otherwise documented below in the visit note. 

## 2015-12-09 NOTE — Patient Instructions (Signed)
Go to the lab on the way out.  We'll contact you with your lab report. Try zantac 150mg  twice a day.  See if fatty foods tends to exacerbate this.  Update [place] if not better.  We may need to set up an ultrasound.  Take care.  Glad to see you.

## 2015-12-09 NOTE — Progress Notes (Signed)
Abd pain.  Over the last 6 weeks he has had episodic pain.  Not severe but noticeable.  Usually either around the umbilicus or R side of abd.  Minimal sx today.  Some days are worse than others.  Clearly not constant.  No change in appetite.  No FCNAVD but mildly looser stools recently, though that may be food related (after eating pizza).  No blood in stool.  No changes in urination.  No h/o renal stones.  Rare etoh, no illicits.  Some worse after eating.   PMH and SH reviewed  ROS: Per HPI unless specifically indicated in ROS section   Meds, vitals, and allergies reviewed.   GEN: nad, alert and oriented HEENT: mucous membranes moist NECK: supple w/o LA CV: rrr.  no murmur PULM: ctab, no inc wob ABD: soft, +bs EXT: no edema SKIN: no acute rash RUQ minimally ttp, unclear if from small abd wall bruise (likely bumped abd wall recently hauling firewood), no rebound.  abd not ttp o/w.

## 2015-12-10 ENCOUNTER — Encounter: Payer: Self-pay | Admitting: Family Medicine

## 2015-12-10 DIAGNOSIS — R1011 Right upper quadrant pain: Secondary | ICD-10-CM | POA: Insufficient documentation

## 2015-12-10 NOTE — Assessment & Plan Note (Signed)
Discussed with patient about differential diagnosis. He doesn't having a left-sided pain. He is nontoxic. Does not have an acute abdomen. Minimally tender in the right upper quadrant, this could be related to the abdominal wall. Either way he had some right-sided abdominal symptoms that predates bruising recently noted on the right upper abdomen. This bruise was likely from working in the yard, hauling firewood. He agrees. He could have symptoms related to his gallbladder. He could have reflux related symptoms. At this point would check basic labs. Start ranitidine twice a day. He will monitor his symptoms, to see if symptoms are worse after eating fatty foods. If his labs are unremarkable and if he continues to have symptoms, then we can likely get an abdominal ultrasound set up. Discussed with patient. He agrees. He'll update me as needed. Okay for outpatient f/u.

## 2015-12-14 ENCOUNTER — Other Ambulatory Visit: Payer: Self-pay | Admitting: Family Medicine

## 2015-12-14 DIAGNOSIS — R1011 Right upper quadrant pain: Secondary | ICD-10-CM

## 2015-12-26 ENCOUNTER — Ambulatory Visit
Admission: RE | Admit: 2015-12-26 | Discharge: 2015-12-26 | Disposition: A | Discharge status: Home / Self Care | Payer: BLUE CROSS/BLUE SHIELD | Source: Ambulatory Visit | Attending: Family Medicine | Admitting: Family Medicine

## 2015-12-26 DIAGNOSIS — R1011 Right upper quadrant pain: Secondary | ICD-10-CM

## 2015-12-27 ENCOUNTER — Ambulatory Visit (INDEPENDENT_AMBULATORY_CARE_PROVIDER_SITE_OTHER): Payer: BLUE CROSS/BLUE SHIELD | Admitting: Internal Medicine

## 2015-12-27 ENCOUNTER — Encounter: Payer: Self-pay | Admitting: Internal Medicine

## 2015-12-27 VITALS — BP 120/78 | HR 61 | Temp 98.1°F | Ht 70.0 in | Wt 201.0 lb

## 2015-12-27 DIAGNOSIS — Z125 Encounter for screening for malignant neoplasm of prostate: Secondary | ICD-10-CM

## 2015-12-27 DIAGNOSIS — Z0000 Encounter for general adult medical examination without abnormal findings: Secondary | ICD-10-CM

## 2015-12-27 LAB — LIPID PANEL
Cholesterol: 180 mg/dL (ref 0–200)
HDL: 53 mg/dL (ref >39.00)
LDL Cholesterol: 102 mg/dL — ABNORMAL HIGH (ref 0–99)
NonHDL: 127.26
Total CHOL/HDL Ratio: 3
Triglycerides: 124 mg/dL (ref 0.0–149.0)
VLDL: 24.8 mg/dL (ref 0.0–40.0)

## 2015-12-27 LAB — PSA: PSA: 0.71 ng/mL (ref 0.10–4.00)

## 2015-12-27 NOTE — Patient Instructions (Signed)

## 2015-12-27 NOTE — Progress Notes (Signed)
Subjective:    Patient ID: Mark Francis, male    DOB: May 09, 1975, 40 y.o.   MRN: [id number]  HPI  Pt presents to the clinic today for his annual exam.   Flu: never Tetanus: within 10 years Dentist: biannually  Diet: He does eat meat. He consumes fruits and veggies daily. He does eat some fried food. He drinks mostly water, some soda. Exercise: None outside of farming.   Review of Systems      Past Medical History:  Diagnosis Date  . Allergy     Current Outpatient Prescriptions  Medication Sig Dispense Refill  . ranitidine (ZANTAC) 150 MG tablet Take 1 tablet (150 mg total) by mouth 2 (two) times daily. (Patient taking differently: Take 150 mg by mouth daily. )     No current facility-administered medications for this visit.     Allergies  Allergen Reactions  . Benzonatate Hives  . Dextromethorphan Hbr Hives    Family History  Problem Relation Age of Onset  . Cancer Paternal Uncle     testicular  . Cancer Paternal Grandfather     lyphoma  . Diabetes Neg Hx   . Early death Neg Hx   . Hypertension Neg Hx   . Hyperlipidemia Neg Hx   . Stroke Neg Hx     Social History   Social History  . Marital status: Married    Spouse name: N/A  . Number of children: N/A  . Years of education: N/A   Occupational History  . Not on file.   Social History Main Topics  . Smoking status: Never Smoker  . Smokeless tobacco: Never Used  . Alcohol use Yes     Comment: occasional  . Drug use: No  . Sexual activity: Yes   Other Topics Concern  . Not on file   Social History Narrative  . No narrative on file     Constitutional: Denies fever, malaise, fatigue, headache or abrupt weight changes.  HEENT: Denies eye pain, eye redness, ear pain, ringing in the ears, wax buildup, runny nose, nasal congestion, bloody nose, or sore throat. Respiratory: Denies difficulty breathing, shortness of breath, cough or sputum production.   Cardiovascular: Denies chest pain, chest  tightness, palpitations or swelling in the hands or feet.  Gastrointestinal: Pt reports intermittent reflux. Denies abdominal pain, bloating, constipation, diarrhea or blood in the stool.  GU: Denies urgency, frequency, pain with urination, burning sensation, blood in urine, odor or discharge. Musculoskeletal: Denies decrease in range of motion, difficulty with gait, muscle pain or joint pain and swelling.  Skin: Denies redness, rashes, lesions or ulcercations.  Neurological: Pt reports intermittent dizziness (history of vertigo) Denies difficulty with memory, difficulty with speech or problems with balance and coordination.  Psych: Denies anxiety, depression, SI/HI.  No other specific complaints in a complete review of systems (except as listed in HPI above).  Objective:   Physical Exam   BP 120/78   Pulse 61   Temp 98.1 F (36.7 C) (Oral)   Ht 5\' 10"  (1.778 m)   Wt 201 lb (91.2 kg)   SpO2 98%   BMI 28.84 kg/m  Wt Readings from Last 3 Encounters:  12/27/15 201 lb (91.2 kg)  12/09/15 201 lb 8 oz (91.4 kg)  10/06/15 207 lb 8 oz (94.1 kg)    General: Appears her stated age, well developed, well nourished in NAD. Skin: Warm, dry and intact. HEENT: Head: normal shape and size; Eyes: sclera white, no icterus, conjunctiva pink,  PERRLA and EOMs intact; Ears: Tm's gray and intact, normal light reflex; Throat/Mouth: Teeth present, mucosa pink and moist, no exudate, lesions or ulcerations noted.  Neck:  Neck supple, trachea midline. No masses, lumps or thyromegaly present.  Cardiovascular: Normal rate and rhythm. S1,S2 noted.  No murmur, rubs or gallops noted. No JVD or BLE edema.  Pulmonary/Chest: Normal effort and positive vesicular breath sounds. No respiratory distress. No wheezes, rales or ronchi noted.  Abdomen: Soft and nontender. Normal bowel sounds. No distention or masses noted. Liver, spleen and kidneys non palpable. Musculoskeletal: Normal range of motion. No signs of joint  swelling. Strength 5/5 BUE/BLE. No difficulty with gait.  Neurological: Alert and oriented. Cranial nerves II-XII grossly intact. Coordination normal.  Psychiatric: Mood and affect normal. Behavior is normal. Judgment and thought content normal.    BMET    Component Value Date/Time   NA 139 12/09/2015 1559   K 4.0 12/09/2015 1559   CL 102 12/09/2015 1559   CO2 26 12/09/2015 1559   GLUCOSE 119 (H) 12/09/2015 1559   BUN 13 12/09/2015 1559   CREATININE 0.91 12/09/2015 1559   CALCIUM 9.1 12/09/2015 1559    Lipid Panel     Component Value Date/Time   CHOL 183 12/25/2012 1512   TRIG 134.0 12/25/2012 1512   HDL 55.80 12/25/2012 1512   CHOLHDL 3 12/25/2012 1512   VLDL 26.8 12/25/2012 1512   LDLCALC 100 (H) 12/25/2012 1512    CBC    Component Value Date/Time   WBC 7.1 12/09/2015 1559   RBC 4.99 12/09/2015 1559   HGB 13.7 12/09/2015 1559   HCT 41.7 12/09/2015 1559   PLT 218 12/09/2015 1559   MCV 83.6 12/09/2015 1559   MCH 27.5 12/09/2015 1559   MCHC 32.9 12/09/2015 1559   RDW 13.6 12/09/2015 1559   LYMPHSABS 1,988 12/09/2015 1559   MONOABS 497 12/09/2015 1559   EOSABS 142 12/09/2015 1559   BASOSABS 71 12/09/2015 1559    Hgb A1C No results found for: HGBA1C   Assessment & Plan:   Preventative Health Maintenance:  He declines flu shot today He reports his tetanus is UTD Encouraged her to consume a balanced diet and exercise regimen Encouraged him to see an eye doctor and dentist annually Will check lipid and PSA (per pt request)  RTC in 1 year, sooner if needed Webb Silversmith, NP

## 2015-12-29 ENCOUNTER — Encounter: Payer: Self-pay | Admitting: *Deleted

## 2016-05-02 DIAGNOSIS — D2261 Melanocytic nevi of right upper limb, including shoulder: Secondary | ICD-10-CM | POA: Diagnosis not present

## 2016-05-02 DIAGNOSIS — D225 Melanocytic nevi of trunk: Secondary | ICD-10-CM | POA: Diagnosis not present

## 2016-05-02 DIAGNOSIS — D2262 Melanocytic nevi of left upper limb, including shoulder: Secondary | ICD-10-CM | POA: Diagnosis not present

## 2016-05-02 DIAGNOSIS — Z85828 Personal history of other malignant neoplasm of skin: Secondary | ICD-10-CM | POA: Diagnosis not present

## 2016-08-27 DIAGNOSIS — Z85828 Personal history of other malignant neoplasm of skin: Secondary | ICD-10-CM | POA: Diagnosis not present

## 2016-08-27 DIAGNOSIS — D2271 Melanocytic nevi of right lower limb, including hip: Secondary | ICD-10-CM | POA: Diagnosis not present

## 2016-08-27 DIAGNOSIS — D225 Melanocytic nevi of trunk: Secondary | ICD-10-CM | POA: Diagnosis not present

## 2016-08-27 DIAGNOSIS — D2262 Melanocytic nevi of left upper limb, including shoulder: Secondary | ICD-10-CM | POA: Diagnosis not present

## 2017-02-14 DIAGNOSIS — D2261 Melanocytic nevi of right upper limb, including shoulder: Secondary | ICD-10-CM | POA: Diagnosis not present

## 2017-02-14 DIAGNOSIS — Z85828 Personal history of other malignant neoplasm of skin: Secondary | ICD-10-CM | POA: Diagnosis not present

## 2017-02-14 DIAGNOSIS — D485 Neoplasm of uncertain behavior of skin: Secondary | ICD-10-CM | POA: Diagnosis not present

## 2017-02-14 DIAGNOSIS — D2262 Melanocytic nevi of left upper limb, including shoulder: Secondary | ICD-10-CM | POA: Diagnosis not present

## 2017-02-14 DIAGNOSIS — D225 Melanocytic nevi of trunk: Secondary | ICD-10-CM | POA: Diagnosis not present

## 2017-08-12 ENCOUNTER — Ambulatory Visit (INDEPENDENT_AMBULATORY_CARE_PROVIDER_SITE_OTHER): Payer: BLUE CROSS/BLUE SHIELD | Admitting: Internal Medicine

## 2017-08-12 ENCOUNTER — Encounter: Payer: Self-pay | Admitting: Internal Medicine

## 2017-08-12 VITALS — BP 120/78 | HR 84 | Temp 98.0°F | Ht 70.0 in | Wt 199.8 lb

## 2017-08-12 DIAGNOSIS — M25552 Pain in left hip: Secondary | ICD-10-CM

## 2017-08-12 DIAGNOSIS — Z0000 Encounter for general adult medical examination without abnormal findings: Secondary | ICD-10-CM

## 2017-08-12 DIAGNOSIS — Z125 Encounter for screening for malignant neoplasm of prostate: Secondary | ICD-10-CM | POA: Diagnosis not present

## 2017-08-12 NOTE — Patient Instructions (Signed)

## 2017-08-12 NOTE — Progress Notes (Signed)
Subjective:    Patient ID: Mark Francis, male    DOB: 07/21/1975, 42 y.o.   MRN: [id number]  HPI  Pt presents to the clinic today for his annual exam.  GERD: He reports this hasn't been an issue since he lost some weight. He takes Ranitidine as needed with good relief.  He is also c/o left hip pain. He reports this started 6 months ago. He describes the pain as sore and achy. The pain does not radiate. He is not sure if pain is worse with sitting or walking but it is definitely worse with laying on that side. He takes Ibuprofen as needed with good relief.   Flu: never Tetanus: 2013 Vision Screening: as needed Dentist: biannually  Diet: He does eat meat. He consumes some fruits and veggies. He occassionally eats fried foods. He drinks mostly  Exercise: None outside of farming.  Review of Systems      Past Medical History:  Diagnosis Date  . Allergy     Current Outpatient Medications  Medication Sig Dispense Refill  . ranitidine (ZANTAC) 150 MG tablet Take 1 tablet (150 mg total) by mouth 2 (two) times daily. (Patient taking differently: Take 150 mg by mouth daily. )     No current facility-administered medications for this visit.     Allergies  Allergen Reactions  . Benzonatate Hives  . Dextromethorphan Hbr Hives    Family History  Problem Relation Age of Onset  . Cancer Paternal Uncle        testicular  . Cancer Paternal Grandfather        lyphoma  . Diabetes Neg Hx   . Early death Neg Hx   . Hypertension Neg Hx   . Hyperlipidemia Neg Hx   . Stroke Neg Hx     Social History   Socioeconomic History  . Marital status: Married    Spouse name: Not on file  . Number of children: Not on file  . Years of education: Not on file  . Highest education level: Not on file  Occupational History  . Not on file  Social Needs  . Financial resource strain: Not on file  . Food insecurity:    Worry: Not on file    Inability: Not on file  . Transportation needs:      Medical: Not on file    Non-medical: Not on file  Tobacco Use  . Smoking status: Never Smoker  . Smokeless tobacco: Never Used  Substance and Sexual Activity  . Alcohol use: Yes    Comment: occasional  . Drug use: No  . Sexual activity: Yes  Lifestyle  . Physical activity:    Days per week: Not on file    Minutes per session: Not on file  . Stress: Not on file  Relationships  . Social connections:    Talks on phone: Not on file    Gets together: Not on file    Attends religious service: Not on file    Active member of club or organization: Not on file    Attends meetings of clubs or organizations: Not on file    Relationship status: Not on file  . Intimate partner violence:    Fear of current or ex partner: Not on file    Emotionally abused: Not on file    Physically abused: Not on file    Forced sexual activity: Not on file  Other Topics Concern  . Not on file  Social History Narrative  .  Not on file     Constitutional: Denies fever, malaise, fatigue, headache or abrupt weight changes.  HEENT: Denies eye pain, eye redness, ear pain, ringing in the ears, wax buildup, runny nose, nasal congestion, bloody nose, or sore throat. Respiratory: Denies difficulty breathing, shortness of breath, cough or sputum production.   Cardiovascular: Denies chest pain, chest tightness, palpitations or swelling in the hands or feet.  Gastrointestinal: Pt reports intermittent reflux. Denies abdominal pain, bloating, constipation, diarrhea or blood in the stool.  GU: Denies urgency, frequency, pain with urination, burning sensation, blood in urine, odor or discharge. Musculoskeletal: Pt reports left hip pain. Denies decrease in range of motion, difficulty with gait, muscle pain or joint swelling.  Skin: Denies redness, rashes, lesions or ulcercations.  Neurological: Denies dizziness, difficulty with memory, difficulty with speech or problems with balance and coordination.  Psych: Denies  anxiety, depression, SI/HI.  No other specific complaints in a complete review of systems (except as listed in HPI above).  Objective:   Physical Exam  BP 120/78   Pulse 84   Temp 98 F (36.7 C) (Oral)   Ht 5\' 10"  (1.778 m)   Wt 199 lb 12 oz (90.6 kg)   BMI 28.66 kg/m  Wt Readings from Last 3 Encounters:  08/12/17 199 lb 12 oz (90.6 kg)  12/27/15 201 lb (91.2 kg)  12/09/15 201 lb 8 oz (91.4 kg)    General: Appears his stated age, well developed, well nourished in NAD. Skin: Warm, dry and intact.  HEENT: Head: normal shape and size; Eyes: sclera white, no icterus, conjunctiva pink, PERRLA and EOMs intact; Ears: Tm's gray and intact, normal light reflex; Throat/Mouth: Teeth present, mucosa pink and moist, no exudate, lesions or ulcerations noted.  Neck:  Neck supple, trachea midline. No masses, lumps or thyromegaly present.  Cardiovascular: Normal rate and rhythm. S1,S2 noted.  No murmur, rubs or gallops noted. No JVD or BLE edema. Pulmonary/Chest: Normal effort and positive vesicular breath sounds. No respiratory distress. No wheezes, rales or ronchi noted.  Abdomen: Soft and nontender. Normal bowel sounds. No distention or masses noted. Liver, spleen and kidneys non palpable. Musculoskeletal: Normal flexion, extension, abduction and adduction of the left hip. Normal internal and external rotation of the left hip. No pain with palpation. Strength 5/5 BUE/BLE. No difficulty with gait.  Neurological: Alert and oriented. Cranial nerves II-XII grossly intact. Coordination normal.  Psychiatric: Mood and affect normal. Behavior is normal. Judgment and thought content normal.    BMET    Component Value Date/Time   NA 139 08/12/2017 1600   K 4.1 08/12/2017 1600   CL 103 08/12/2017 1600   CO2 28 08/12/2017 1600   GLUCOSE 95 08/12/2017 1600   BUN 10 08/12/2017 1600   CREATININE 0.86 08/12/2017 1600   CREATININE 0.91 12/09/2015 1559   CALCIUM 9.4 08/12/2017 1600    Lipid Panel       Component Value Date/Time   CHOL 182 08/12/2017 1600   TRIG 92.0 08/12/2017 1600   HDL 55.70 08/12/2017 1600   CHOLHDL 3 08/12/2017 1600   VLDL 18.4 08/12/2017 1600   LDLCALC 108 (H) 08/12/2017 1600    CBC    Component Value Date/Time   WBC 6.7 08/12/2017 1600   RBC 4.81 08/12/2017 1600   HGB 13.8 08/12/2017 1600   HCT 40.2 08/12/2017 1600   PLT 191.0 08/12/2017 1600   MCV 83.6 08/12/2017 1600   MCH 27.5 12/09/2015 1559   MCHC 34.4 08/12/2017 1600   RDW  13.1 08/12/2017 1600   LYMPHSABS 1,988 12/09/2015 1559   MONOABS 497 12/09/2015 1559   EOSABS 142 12/09/2015 1559   BASOSABS 71 12/09/2015 1559    Hgb A1C Lab Results  Component Value Date   HGBA1C 5.5 08/12/2017            Assessment & Plan:   Preventative Health Maintenance:  Advised him to get a flu shot in the fall Tetanus UTD Encouraged him to consume a balanced diet and exercise regimen Advised him to see an eye doctor and dentist annually Will check CBC, CMET, TSH, Lipid, A1C, PSA, Vit D, B12  Left Hip Pain:  Encouraged NSAID's Hip exercises given  RTC in 1 year, or sooner if needed Webb Silversmith, NP

## 2017-08-13 ENCOUNTER — Other Ambulatory Visit: Payer: Self-pay | Admitting: Internal Medicine

## 2017-08-13 DIAGNOSIS — E559 Vitamin D deficiency, unspecified: Secondary | ICD-10-CM

## 2017-08-13 LAB — CBC
HEMATOCRIT: 40.2 % (ref 39.0–52.0)
Hemoglobin: 13.8 g/dL (ref 13.0–17.0)
MCHC: 34.4 g/dL (ref 30.0–36.0)
MCV: 83.6 fl (ref 78.0–100.0)
PLATELETS: 191 10*3/uL (ref 150.0–400.0)
RBC: 4.81 Mil/uL (ref 4.22–5.81)
RDW: 13.1 % (ref 11.5–15.5)
WBC: 6.7 10*3/uL (ref 4.0–10.5)

## 2017-08-13 LAB — LIPID PANEL
Cholesterol: 182 mg/dL (ref 0–200)
HDL: 55.7 mg/dL (ref >39.00)
LDL Cholesterol: 108 mg/dL — ABNORMAL HIGH (ref 0–99)
NonHDL: 126.32
TRIGLYCERIDES: 92 mg/dL (ref 0.0–149.0)
Total CHOL/HDL Ratio: 3
VLDL: 18.4 mg/dL (ref 0.0–40.0)

## 2017-08-13 LAB — COMPREHENSIVE METABOLIC PANEL
ALK PHOS: 67 U/L (ref 39–117)
ALT: 13 U/L (ref 0–53)
AST: 17 U/L (ref 0–37)
Albumin: 4.6 g/dL (ref 3.5–5.2)
BILIRUBIN TOTAL: 0.5 mg/dL (ref 0.2–1.2)
BUN: 10 mg/dL (ref 6–23)
CALCIUM: 9.4 mg/dL (ref 8.4–10.5)
CO2: 28 meq/L (ref 19–32)
Chloride: 103 mEq/L (ref 96–112)
Creatinine, Ser: 0.86 mg/dL (ref 0.40–1.50)
GFR: 104.11 mL/min (ref >60.00)
GLUCOSE: 95 mg/dL (ref 70–99)
POTASSIUM: 4.1 meq/L (ref 3.5–5.1)
Sodium: 139 mEq/L (ref 135–145)
TOTAL PROTEIN: 6.7 g/dL (ref 6.0–8.3)

## 2017-08-13 LAB — VITAMIN B12: VITAMIN B 12: 314 pg/mL (ref 211–911)

## 2017-08-13 LAB — VITAMIN D 25 HYDROXY (VIT D DEFICIENCY, FRACTURES): VITD: 14.18 ng/mL — AB (ref 30.00–100.00)

## 2017-08-13 LAB — TSH: TSH: 1.87 u[IU]/mL (ref 0.35–4.50)

## 2017-08-13 LAB — HEMOGLOBIN A1C: HEMOGLOBIN A1C: 5.5 % (ref 4.6–6.5)

## 2017-08-13 LAB — PSA: PSA: 0.73 ng/mL (ref 0.10–4.00)

## 2017-08-13 MED ORDER — VITAMIN D (ERGOCALCIFEROL) 1.25 MG (50000 UNIT) PO CAPS: 50000.0000 [IU] | ORAL_CAPSULE | ORAL | 0 refills | Status: DC

## 2017-08-20 DIAGNOSIS — D225 Melanocytic nevi of trunk: Secondary | ICD-10-CM | POA: Diagnosis not present

## 2017-08-20 DIAGNOSIS — D485 Neoplasm of uncertain behavior of skin: Secondary | ICD-10-CM | POA: Diagnosis not present

## 2017-08-20 DIAGNOSIS — Z85828 Personal history of other malignant neoplasm of skin: Secondary | ICD-10-CM | POA: Diagnosis not present

## 2017-08-20 DIAGNOSIS — D2261 Melanocytic nevi of right upper limb, including shoulder: Secondary | ICD-10-CM | POA: Diagnosis not present

## 2017-08-20 DIAGNOSIS — D224 Melanocytic nevi of scalp and neck: Secondary | ICD-10-CM | POA: Diagnosis not present

## 2018-02-18 DIAGNOSIS — D225 Melanocytic nevi of trunk: Secondary | ICD-10-CM | POA: Diagnosis not present

## 2018-02-18 DIAGNOSIS — D2262 Melanocytic nevi of left upper limb, including shoulder: Secondary | ICD-10-CM | POA: Diagnosis not present

## 2018-02-18 DIAGNOSIS — D485 Neoplasm of uncertain behavior of skin: Secondary | ICD-10-CM | POA: Diagnosis not present

## 2018-02-18 DIAGNOSIS — Z85828 Personal history of other malignant neoplasm of skin: Secondary | ICD-10-CM | POA: Diagnosis not present

## 2018-02-18 DIAGNOSIS — D2261 Melanocytic nevi of right upper limb, including shoulder: Secondary | ICD-10-CM | POA: Diagnosis not present

## 2018-08-20 DIAGNOSIS — D2262 Melanocytic nevi of left upper limb, including shoulder: Secondary | ICD-10-CM | POA: Diagnosis not present

## 2018-08-20 DIAGNOSIS — D485 Neoplasm of uncertain behavior of skin: Secondary | ICD-10-CM | POA: Diagnosis not present

## 2018-08-20 DIAGNOSIS — Z85828 Personal history of other malignant neoplasm of skin: Secondary | ICD-10-CM | POA: Diagnosis not present

## 2018-08-20 DIAGNOSIS — D225 Melanocytic nevi of trunk: Secondary | ICD-10-CM | POA: Diagnosis not present

## 2018-08-20 DIAGNOSIS — D2261 Melanocytic nevi of right upper limb, including shoulder: Secondary | ICD-10-CM | POA: Diagnosis not present

## 2018-08-27 ENCOUNTER — Telehealth: Payer: Self-pay

## 2018-08-27 NOTE — Telephone Encounter (Signed)
Copied from [place] [id number]. Topic: Appointment Scheduling - Scheduling Inquiry for Clinic >> Aug 26, 2018  4:24 PM Berneta Levins wrote: Reason for CRM:   Pt calling requesting an appointment.  States that he hasn't been feeling well for about two days now, extreme fatigue, and has started to notice that he has swollen lymph nodes in lower abdominal area and would like to be seen.

## 2018-08-28 ENCOUNTER — Ambulatory Visit (INDEPENDENT_AMBULATORY_CARE_PROVIDER_SITE_OTHER): Payer: BC Managed Care – PPO | Admitting: Internal Medicine

## 2018-08-28 ENCOUNTER — Other Ambulatory Visit: Payer: Self-pay

## 2018-08-28 ENCOUNTER — Encounter: Payer: Self-pay | Admitting: Internal Medicine

## 2018-08-28 VITALS — BP 110/76 | HR 83 | Temp 98.1°F | Wt 203.0 lb

## 2018-08-28 DIAGNOSIS — W57XXXA Bitten or stung by nonvenomous insect and other nonvenomous arthropods, initial encounter: Secondary | ICD-10-CM | POA: Diagnosis not present

## 2018-08-28 DIAGNOSIS — S70361A Insect bite (nonvenomous), right thigh, initial encounter: Secondary | ICD-10-CM

## 2018-08-28 DIAGNOSIS — R5383 Other fatigue: Secondary | ICD-10-CM

## 2018-08-28 DIAGNOSIS — R590 Localized enlarged lymph nodes: Secondary | ICD-10-CM

## 2018-08-28 LAB — COMPREHENSIVE METABOLIC PANEL
ALT: 46 U/L (ref 0–53)
AST: 37 U/L (ref 0–37)
Albumin: 4.4 g/dL (ref 3.5–5.2)
Alkaline Phosphatase: 72 U/L (ref 39–117)
BUN: 12 mg/dL (ref 6–23)
CO2: 28 mEq/L (ref 19–32)
Calcium: 8.6 mg/dL (ref 8.4–10.5)
Chloride: 102 mEq/L (ref 96–112)
Creatinine, Ser: 0.94 mg/dL (ref 0.40–1.50)
GFR: 87.95 mL/min (ref >60.00)
Glucose, Bld: 95 mg/dL (ref 70–99)
Potassium: 4.7 mEq/L (ref 3.5–5.1)
Sodium: 137 mEq/L (ref 135–145)
Total Bilirubin: 0.8 mg/dL (ref 0.2–1.2)
Total Protein: 6.6 g/dL (ref 6.0–8.3)

## 2018-08-28 LAB — CBC
HCT: 43.2 % (ref 39.0–52.0)
Hemoglobin: 14.8 g/dL (ref 13.0–17.0)
MCHC: 34.2 g/dL (ref 30.0–36.0)
MCV: 82.7 fl (ref 78.0–100.0)
Platelets: 152 10*3/uL (ref 150.0–400.0)
RBC: 5.23 Mil/uL (ref 4.22–5.81)
RDW: 13.4 % (ref 11.5–15.5)
WBC: 3.9 10*3/uL — ABNORMAL LOW (ref 4.0–10.5)

## 2018-08-28 LAB — TSH: TSH: 1.93 u[IU]/mL (ref 0.35–4.50)

## 2018-08-28 NOTE — Telephone Encounter (Signed)
Appt made

## 2018-08-28 NOTE — Patient Instructions (Signed)
Lymphadenopathy    Lymphadenopathy means that your lymph glands are swollen or larger than normal (enlarged). Lymph glands, also called lymph nodes, are collections of tissue that filter bacteria, viruses, and waste from your bloodstream. They are part of your body's disease-fighting system (immune system), which protects your body from germs.  There may be different causes of lymphadenopathy, depending on where it is in your body. Some types go away on their own. Lymphadenopathy can occur anywhere that you have lymph glands, including these areas:  · Neck (cervical lymphadenopathy).  · Chest (mediastinal lymphadenopathy).  · Lungs (hilar lymphadenopathy).  · Underarms (axillary lymphadenopathy).  · Groin (inguinal lymphadenopathy).  When your immune system responds to germs, infection-fighting cells and fluid build up in your lymph glands. This causes some swelling and enlargement. If the lymph glands do not go back to normal after you have an infection or disease, your health care provider may do tests. These tests help to monitor your condition and find the reason why the glands are still swollen and enlarged.  Follow these instructions at home:  · Get plenty of rest.  · Take over-the-counter and prescription medicines only as told by your health care provider. Your health care provider may recommend over-the-counter medicines for pain.  · If directed, apply heat to swollen lymph glands as often as told by your health care provider. Use the heat source that your health care provider recommends, such as a moist heat pack or a heating pad.  ? Place a towel between your skin and the heat source.  ? Leave the heat on for 20-30 minutes.  ? Remove the heat if your skin turns bright red. This is especially important if you are unable to feel pain, heat, or cold. You may have a greater risk of getting burned.  · Check your affected lymph glands every day for changes. Check other lymph gland areas as told by your health  care provider. Check for changes such as:  ? More swelling.  ? Sudden increase in size.  ? Redness or pain.  ? Hardness.  · Keep all follow-up visits as told by your health care provider. This is important.  Contact a health care provider if you have:  · Swelling that gets worse or spreads to other areas.  · Problems with breathing.  · Lymph glands that:  ? Are still swollen after 2 weeks.  ? Have suddenly gotten bigger.  ? Are red, painful, or hard.  · A fever or chills.  · Fatigue.  · A sore throat.  · Pain in your abdomen.  · Weight loss.  · Night sweats.  Get help right away if you have:  · Fluid leaking from an enlarged lymph gland.  · Severe pain.  · Chest pain.  · Shortness of breath.  Summary  · Lymphadenopathy means that your lymph glands are swollen or larger than normal (enlarged).  · Lymph glands (also called lymph nodes) are collections of tissue that filter bacteria, viruses, and waste from the bloodstream. They are part of your body's disease-fighting system (immune system).  · Lymphadenopathy can occur anywhere that you have lymph glands.  · If your enlarged and swollen lymph glands do not go back to normal after you have an infection or disease, your health care provider may do tests to monitor your condition and find the reason why the glands are still swollen and enlarged.  · Check your affected lymph glands every day for changes. Check other lymph   gland areas as told by your health care provider.  This information is not intended to replace advice given to you by your health care provider. Make sure you discuss any questions you have with your health care provider.  Document Released: 11/29/2007 Document Revised: 01/04/2017 Document Reviewed: 01/04/2017  Elsevier Interactive Patient Education © 2019 [employer]

## 2018-08-28 NOTE — Progress Notes (Signed)
Subjective:    Patient ID: Mark Francis, male    DOB: 12-25-1975, 43 y.o.   MRN: [id number]  HPI  Pt presents to the clinic today with c/o swollen lymph nodes in his right groin area. He noticed this 5 days ago. He reports the lymph nodes are mildly tender. He denies rash, abdominal pain, penile discharge, urinary symptoms or testicular pain and swelling. He has felt a little fatigued, but denies fever, chills or body aches. He reports he has pulled 3 ticks off him in the last month. He did not recall that any were engorged. He denies confusion, headaches, nausea, diarrhea or joint pains. He has tried Tylenol OTC with minimal relief.  Review of Systems      Past Medical History:  Diagnosis Date  . Allergy     Current Outpatient Medications  Medication Sig Dispense Refill  . acetaminophen (TYLENOL) 500 MG tablet Take 500 mg by mouth every 6 (six) hours as needed.    Marland Kitchen ibuprofen (ADVIL,MOTRIN) 200 MG tablet Take 200 mg by mouth every 6 (six) hours as needed.     No current facility-administered medications for this visit.     Allergies  Allergen Reactions  . Benzonatate Hives  . Dextromethorphan Hbr Hives    Family History  Problem Relation Age of Onset  . Cancer Paternal Uncle        testicular  . Cancer Paternal Grandfather        lyphoma  . Diabetes Neg Hx   . Early death Neg Hx   . Hypertension Neg Hx   . Hyperlipidemia Neg Hx   . Stroke Neg Hx     Social History   Socioeconomic History  . Marital status: Married    Spouse name: Not on file  . Number of children: Not on file  . Years of education: Not on file  . Highest education level: Not on file  Occupational History  . Not on file  Social Needs  . Financial resource strain: Not on file  . Food insecurity    Worry: Not on file    Inability: Not on file  . Transportation needs    Medical: Not on file    Non-medical: Not on file  Tobacco Use  . Smoking status: Never Smoker  . Smokeless tobacco:  Never Used  Substance and Sexual Activity  . Alcohol use: Yes    Comment: occasional  . Drug use: No  . Sexual activity: Yes  Lifestyle  . Physical activity    Days per week: Not on file    Minutes per session: Not on file  . Stress: Not on file  Relationships  . Social Herbalist on phone: Not on file    Gets together: Not on file    Attends religious service: Not on file    Active member of club or organization: Not on file    Attends meetings of clubs or organizations: Not on file    Relationship status: Not on file  . Intimate partner violence    Fear of current or ex partner: Not on file    Emotionally abused: Not on file    Physically abused: Not on file    Forced sexual activity: Not on file  Other Topics Concern  . Not on file  Social History Narrative  . Not on file     Constitutional: Pt reports fatigue. Denies fever, malaise, headache or abrupt weight changes.  HEENT: Denies eye  pain, eye redness, ear pain, ringing in the ears, wax buildup, runny nose, nasal congestion, bloody nose, or sore throat. Respiratory: Denies difficulty breathing, shortness of breath, cough or sputum production.   Cardiovascular: Denies chest pain, chest tightness, palpitations or swelling in the hands or feet.  Gastrointestinal: Denies abdominal pain, bloating, constipation, diarrhea or blood in the stool.  GU: Denies urgency, frequency, pain with urination, burning sensation, blood in urine, odor or discharge. Musculoskeletal: Denies decrease in range of motion, difficulty with gait, muscle pain or joint pain and swelling.  Skin: Pt reports swollen lymph nodes in groin. Denies redness, rashes, lesions or ulcercations.  Neurological: Denies dizziness, difficulty with memory, difficulty with speech or problems with balance and coordination.  Psych: Pt reports stress. Denies anxiety, depression, SI/HI.  No other specific complaints in a complete review of systems (except as  listed in HPI above).  Objective:   Physical Exam   BP 110/76   Pulse 83   Temp 98.1 F (36.7 C) (Oral)   Wt 203 lb (92.1 kg)   SpO2 98%   BMI 29.13 kg/m  Wt Readings from Last 3 Encounters:  08/28/18 203 lb (92.1 kg)  08/12/17 199 lb 12 oz (90.6 kg)  12/27/15 201 lb (91.2 kg)    General: Appears his stated age, well developed, well nourished in NAD. Skin: Warm, dry and intact. Tick bite with local reaction noted of inner right thigh. Right inguinal lymphadenopathy noted. HCardiovascular: Normal rate and rhythm. S1,S2 noted.  No murmur, rubs or gallops noted. Pulmonary/Chest: Normal effort and positive vesicular breath sounds. No respiratory distress. No wheezes, rales or ronchi noted.  Abdomen: Soft and nontender. Normal bowel sounds. No distention or masses noted.  Neurological: Alert and oriented.     BMET    Component Value Date/Time   NA 139 08/12/2017 1600   K 4.1 08/12/2017 1600   CL 103 08/12/2017 1600   CO2 28 08/12/2017 1600   GLUCOSE 95 08/12/2017 1600   BUN 10 08/12/2017 1600   CREATININE 0.86 08/12/2017 1600   CREATININE 0.91 12/09/2015 1559   CALCIUM 9.4 08/12/2017 1600    Lipid Panel     Component Value Date/Time   CHOL 182 08/12/2017 1600   TRIG 92.0 08/12/2017 1600   HDL 55.70 08/12/2017 1600   CHOLHDL 3 08/12/2017 1600   VLDL 18.4 08/12/2017 1600   LDLCALC 108 (H) 08/12/2017 1600    CBC    Component Value Date/Time   WBC 6.7 08/12/2017 1600   RBC 4.81 08/12/2017 1600   HGB 13.8 08/12/2017 1600   HCT 40.2 08/12/2017 1600   PLT 191.0 08/12/2017 1600   MCV 83.6 08/12/2017 1600   MCH 27.5 12/09/2015 1559   MCHC 34.4 08/12/2017 1600   RDW 13.1 08/12/2017 1600   LYMPHSABS 1,988 12/09/2015 1559   MONOABS 497 12/09/2015 1559   EOSABS 142 12/09/2015 1559   BASOSABS 71 12/09/2015 1559    Hgb A1C Lab Results  Component Value Date   HGBA1C 5.5 08/12/2017           Assessment & Plan:   Right Inguinal Lymphadenopathy, Fatigue,  Tick Bite of Thigh:  He is in a sexually monogamous relationship for the last 14 years He declines STD screening at this time ? Reaction to tick bite Will check CBC, CMET, TSH, B Burgdorferi, RMSF and Ehrlicia antibody panel Advised Ibuprofen would be better for swelling than Tylenol  Will follow up after labs, return precautions discussed Webb Silversmith, NP

## 2018-08-30 LAB — EHRLICHIA ANTIBODY PANEL
E. CHAFFEENSIS AB IGG: <1:64 {titer}
E. CHAFFEENSIS AB IGM: <1:20 {titer}

## 2018-08-30 LAB — ROCKY MTN SPOTTED FVR ABS PNL(IGG+IGM)
RMSF IgG: NOT DETECTED
RMSF IgM: NOT DETECTED

## 2018-09-01 ENCOUNTER — Encounter: Payer: Self-pay | Admitting: Internal Medicine

## 2018-09-01 ENCOUNTER — Encounter: Payer: Self-pay | Admitting: Family Medicine

## 2018-09-01 ENCOUNTER — Ambulatory Visit (INDEPENDENT_AMBULATORY_CARE_PROVIDER_SITE_OTHER): Payer: BC Managed Care – PPO | Admitting: Family Medicine

## 2018-09-01 ENCOUNTER — Telehealth: Payer: Self-pay | Admitting: *Deleted

## 2018-09-01 DIAGNOSIS — J069 Acute upper respiratory infection, unspecified: Secondary | ICD-10-CM

## 2018-09-01 DIAGNOSIS — B9789 Other viral agents as the cause of diseases classified elsewhere: Secondary | ICD-10-CM

## 2018-09-01 DIAGNOSIS — R0981 Nasal congestion: Secondary | ICD-10-CM

## 2018-09-01 MED ORDER — FLUTICASONE PROPIONATE 50 MCG/ACT NA SUSP: 2.0000 | Freq: Every day | NASAL | 6 refills | Status: DC

## 2018-09-01 NOTE — Telephone Encounter (Signed)
Noted  

## 2018-09-01 NOTE — Telephone Encounter (Signed)
Patient called requesting lab results from last week. Webb Silversmith NP has released the results to the patient and he has gotten them. Patient stated that he is having headaches, sinus pressure and colorful congestion.  Patient stated that his lymph nodes are still swollen.  Webb Silversmith NP stated that patient needs a virtual visit with a provider in the office because the sinus issue is not what she saw him for last week.  Patient scheduled for a virtual visit with Tor Netters NP today at 4:00.

## 2018-09-01 NOTE — Progress Notes (Signed)
Virtual Visit via Video Note  I connected with Mark Francis on 09/01/18 at  4:12 PM EDT by a video enabled telemedicine application and verified that I am speaking with the correct person using two identifiers.  Location: Patient: In his office.  Provider: [place]   I discussed the limitations of evaluation and management by telemedicine and the availability of in person appointments. The patient expressed understanding and agreed to proceed.  History of Present Illness: This is a 43 year old male who requests virtual visit to discuss several symptoms. He was seen 08/28/2018 by his PCP Mark Ace, NP for right inguinal lymphadenopathy.  He had had some recent tick bites so labs were obtained.  Ehrlichia antibody and [facility] spotted fever antibody testing was negative.  He had a normal CMP and TSH.  His CBC was unremarkable except for WBCs of 3.9 (nml 4.0-10.5).  He reports that his groin area is less tender.  The swelling seems to come and go.  URI symptoms- over the last 4 to 5 days he has had a pounding headache over his eyes, postnasal drainage, cough with an occasional small amount of sputum.  He has had some pressure in his left ear.  He has not had any shortness of breath nor wheeze.  He endorses a subjective fever and chills.  He has been taking Allegra most days and Sudafed PE.  He has had some nausea with headache but otherwise no diarrhea or constipation.   Past Medical History:  Diagnosis Date  . Allergy    Past Surgical History:  Procedure Laterality Date  . INGUINAL HERNIA REPAIR    . MYRINGOTOMY    . TONSILLECTOMY     Family History  Problem Relation Age of Onset  . Cancer Paternal Uncle        testicular  . Cancer Paternal Grandfather        lyphoma  . Diabetes Neg Hx   . Early death Neg Hx   . Hypertension Neg Hx   . Hyperlipidemia Neg Hx   . Stroke Neg Hx    Social History   Tobacco Use  . Smoking status: Never Smoker  . Smokeless  tobacco: Never Used  Substance Use Topics  . Alcohol use: Yes    Comment: occasional  . Drug use: No      Observations/Objective: The patient is alert and answers questions appropriately.  Visible skin is unremarkable.  He is normally conversive without audible wheeze or obvious shortness of breath.  No cough was witnessed.  His mood and affect are appropriate.  There were no vitals taken for this visit. Wt Readings from Last 3 Encounters:  08/28/18 203 lb (92.1 kg)  08/12/17 199 lb 12 oz (90.6 kg)  12/27/15 201 lb (91.2 kg)   BP Readings from Last 3 Encounters:  08/28/18 110/76  08/12/17 120/78  12/27/15 120/78    Assessment and Plan: 1. Viral URI with cough -Viral with recent subjective fever and chills.  Discussed symptomatic relief and sent him instructions via his MyChart account.  Can continue daily antihistamine, will add fluticasone nasal spray and discussed use of pseudoephedrine.  He can continue alternating ibuprofen and Tylenol.  Encouraged adequate fluid intake. -Reviewed his lab results with him -Reviewed return to clinic precautions  2. Nasal congestion -See #1 - fluticasone (FLONASE) 50 MCG/ACT nasal spray; Place 2 sprays into both nostrils daily.  Dispense: 16 g; Refill: 6  3.  Lymphadenopathy -Discussed continuing to monitor, avoiding touching it  very frequently which may cause additional irritation  Clarene Reamer, FNP-BC  [facility] [place] [facility]  09/01/2018 4:40 PM   Follow Up Instructions: Visit recap sent to patient via my chart.   I discussed the assessment and treatment plan with the patient. The patient was provided an opportunity to ask questions and all were answered. The patient agreed with the plan and demonstrated an understanding of the instructions.   The patient was advised to call back or seek an in-person evaluation if the symptoms worsen or if the condition fails to improve as  anticipated.   Elby Beck, FNP

## 2018-09-10 ENCOUNTER — Encounter: Payer: Self-pay | Admitting: Internal Medicine

## 2018-09-12 ENCOUNTER — Ambulatory Visit (INDEPENDENT_AMBULATORY_CARE_PROVIDER_SITE_OTHER): Payer: BC Managed Care – PPO | Admitting: Internal Medicine

## 2018-09-12 ENCOUNTER — Encounter: Payer: Self-pay | Admitting: Internal Medicine

## 2018-09-12 ENCOUNTER — Other Ambulatory Visit: Payer: Self-pay

## 2018-09-12 VITALS — BP 138/80 | HR 84 | Temp 99.2°F | Ht 70.0 in | Wt 203.0 lb

## 2018-09-12 DIAGNOSIS — R590 Localized enlarged lymph nodes: Secondary | ICD-10-CM

## 2018-09-12 MED ORDER — DOXYCYCLINE HYCLATE 100 MG PO TABS: 100.0000 mg | ORAL_TABLET | Freq: Two times a day (BID) | ORAL | 0 refills | Status: DC

## 2018-09-12 NOTE — Patient Instructions (Signed)

## 2018-09-12 NOTE — Progress Notes (Signed)
Subjective:    Patient ID: Mark Francis, male    DOB: 1975-03-22, 43 y.o.   MRN: [id number]  HPI  Patient presents to the clinic today with complaints of persistent right inguinal lymphadenopathy.  This started 2 to 3 weeks ago.  He reports the area is tender.  He noticed it after getting a tick bite but did not notice a rash.  He was seen for the same 6/25.  CBC and tickborne panel was unremarkable.  He denies urinary complaints.  He is sexually active in a monogamous relationship.  He has not taken him or the counter for his symptoms.  He does have a family history of lymphoma and testicular cancer.  Review of Systems      Past Medical History:  Diagnosis Date  . Allergy     Current Outpatient Medications  Medication Sig Dispense Refill  . acetaminophen (TYLENOL) 500 MG tablet Take 500 mg by mouth every 6 (six) hours as needed.    . fluticasone (FLONASE) 50 MCG/ACT nasal spray Place 2 sprays into both nostrils daily. 16 g 6  . ibuprofen (ADVIL,MOTRIN) 200 MG tablet Take 200 mg by mouth every 6 (six) hours as needed.    . doxycycline (VIBRA-TABS) 100 MG tablet Take 1 tablet (100 mg total) by mouth 2 (two) times daily. 20 tablet 0   No current facility-administered medications for this visit.     Allergies  Allergen Reactions  . Benzonatate Hives  . Dextromethorphan Hbr Hives    Family History  Problem Relation Age of Onset  . Cancer Paternal Uncle        testicular  . Cancer Paternal Grandfather        lyphoma  . Diabetes Neg Hx   . Early death Neg Hx   . Hypertension Neg Hx   . Hyperlipidemia Neg Hx   . Stroke Neg Hx     Social History   Socioeconomic History  . Marital status: Married    Spouse name: Not on file  . Number of children: Not on file  . Years of education: Not on file  . Highest education level: Not on file  Occupational History  . Not on file  Social Needs  . Financial resource strain: Not on file  . Food insecurity    Worry: Not on file     Inability: Not on file  . Transportation needs    Medical: Not on file    Non-medical: Not on file  Tobacco Use  . Smoking status: Never Smoker  . Smokeless tobacco: Never Used  Substance and Sexual Activity  . Alcohol use: Yes    Comment: occasional  . Drug use: No  . Sexual activity: Yes  Lifestyle  . Physical activity    Days per week: Not on file    Minutes per session: Not on file  . Stress: Not on file  Relationships  . Social Herbalist on phone: Not on file    Gets together: Not on file    Attends religious service: Not on file    Active member of club or organization: Not on file    Attends meetings of clubs or organizations: Not on file    Relationship status: Not on file  . Intimate partner violence    Fear of current or ex partner: Not on file    Emotionally abused: Not on file    Physically abused: Not on file    Forced sexual activity:  Not on file  Other Topics Concern  . Not on file  Social History Narrative  . Not on file     Constitutional: Denies fever, malaise, fatigue, headache or abrupt weight changes.  Respiratory: Denies difficulty breathing, shortness of breath, cough or sputum production.   Cardiovascular: Denies chest pain, chest tightness, palpitations or swelling in the hands or feet.  Gastrointestinal: Denies abdominal pain, bloating, constipation, diarrhea or blood in the stool.  GU: Denies urgency, frequency, pain with urination, burning sensation, blood in urine, odor or discharge. Skin: Patient reports swollen lymph nodes in right groin area.  Denies redness, rashes, lesions or ulcercations.    No other specific complaints in a complete review of systems (except as listed in HPI above).  Objective:   Physical Exam    BP 138/80 (BP Location: Left Arm, Patient Position: Sitting, Cuff Size: Normal)   Pulse 84   Temp 99.2 F (37.3 C) (Temporal)   Ht 5\' 10"  (1.778 m)   Wt 203 lb (92.1 kg)   SpO2 98%   BMI 29.13 kg/m   Wt Readings from Last 3 Encounters:  09/12/18 203 lb (92.1 kg)  08/28/18 203 lb (92.1 kg)  08/12/17 199 lb 12 oz (90.6 kg)    General: Appears his stated age, well developed, well nourished in NAD. Skin: Warm, dry and intact. No rashes noted.  Right inguinal lymphadenopathy noted. Cardiovascular: Normal rate and rhythm. S1,S2 noted.  No murmur, rubs or gallops noted.  Pulmonary/Chest: Normal effort and positive vesicular breath sounds. No respiratory distress. No wheezes, rales or ronchi noted.  Abdomen: Soft and nontender. Normal bowel sounds. No distention or masses noted.  Neurological: Alert and oriented.    BMET    Component Value Date/Time   NA 137 08/28/2018 0932   K 4.7 08/28/2018 0932   CL 102 08/28/2018 0932   CO2 28 08/28/2018 0932   GLUCOSE 95 08/28/2018 0932   BUN 12 08/28/2018 0932   CREATININE 0.94 08/28/2018 0932   CREATININE 0.91 12/09/2015 1559   CALCIUM 8.6 08/28/2018 0932    Lipid Panel     Component Value Date/Time   CHOL 182 08/12/2017 1600   TRIG 92.0 08/12/2017 1600   HDL 55.70 08/12/2017 1600   CHOLHDL 3 08/12/2017 1600   VLDL 18.4 08/12/2017 1600   LDLCALC 108 (H) 08/12/2017 1600    CBC    Component Value Date/Time   WBC 3.9 (L) 08/28/2018 0932   RBC 5.23 08/28/2018 0932   HGB 14.8 08/28/2018 0932   HCT 43.2 08/28/2018 0932   PLT 152.0 08/28/2018 0932   MCV 82.7 08/28/2018 0932   MCH 27.5 12/09/2015 1559   MCHC 34.2 08/28/2018 0932   RDW 13.4 08/28/2018 0932   LYMPHSABS 1,988 12/09/2015 1559   MONOABS 497 12/09/2015 1559   EOSABS 142 12/09/2015 1559   BASOSABS 71 12/09/2015 1559    Hgb A1C Lab Results  Component Value Date   HGBA1C 5.5 08/12/2017          Assessment & Plan:   Right Inguinal Lymphadenopathy:  Will obtain urine culture, gonorrhea, chlamydia and trichomonas Will check B Burgdorferi AB, HIV and RPR Will start Doxycycline 100 mg BID x 10 days in case this is reactive to the tick bite If labs are normal  and no improvement in symptoms, will obtain CT abdomen and pelvis  Will follow up after labs, return precautions discussed Webb Silversmith, NP

## 2018-09-13 LAB — URINE CULTURE
MICRO NUMBER:: 654893
Result:: NO GROWTH
SPECIMEN QUALITY:: ADEQUATE

## 2018-09-15 ENCOUNTER — Encounter: Payer: Self-pay | Admitting: Internal Medicine

## 2018-09-16 ENCOUNTER — Ambulatory Visit (INDEPENDENT_AMBULATORY_CARE_PROVIDER_SITE_OTHER): Payer: BC Managed Care – PPO

## 2018-09-16 ENCOUNTER — Other Ambulatory Visit: Payer: Self-pay

## 2018-09-16 DIAGNOSIS — R590 Localized enlarged lymph nodes: Secondary | ICD-10-CM

## 2018-09-16 LAB — C. TRACHOMATIS/N. GONORRHOEAE RNA
C. trachomatis RNA, TMA: NOT DETECTED
N. gonorrhoeae RNA, TMA: NOT DETECTED

## 2018-09-16 LAB — B. BURGDORFI ANTIBODIES BY WB

## 2018-09-16 LAB — HIV ANTIBODY (ROUTINE TESTING W REFLEX): HIV 1&2 Ab, 4th Generation: NONREACTIVE

## 2018-09-16 LAB — FLUORESCENT TREPONEMAL AB(FTA)-IGG-BLD: Fluorescent Treponemal ABS: REACTIVE — AB

## 2018-09-16 LAB — MICROALBUMIN / CREATININE URINE RATIO
Creatinine, Urine: 115 mg/dL (ref 20–320)
Microalb Creat Ratio: 3 mcg/mg creat (ref <30)
Microalb, Ur: 0.3 mg/dL

## 2018-09-16 LAB — RPR: RPR Ser Ql: REACTIVE — AB

## 2018-09-16 LAB — TRICHOMONAS VAGINALIS RNA, QL,MALES: Trichomonas vaginalis RNA: NOT DETECTED

## 2018-09-16 LAB — RPR TITER: RPR Titer: 1:1 {titer} — ABNORMAL HIGH

## 2018-09-16 LAB — EXTRA URINE SPECIMEN

## 2018-09-16 MED ORDER — PENICILLIN G BENZATHINE 1200000 UNIT/2ML IM SUSP
1.2000 10*6.[IU] | Freq: Once | INTRAMUSCULAR | Status: AC
  Administered 2018-09-16: 1.2 10*6.[IU] via INTRAMUSCULAR

## 2018-09-16 NOTE — Progress Notes (Signed)
Per orders of Webb Silversmith, NP, injection of Bicillin LA 2,400,000 units administered deep IM in 2 divided doses each 1.3million units, one to Left ventrogluteal and 1 to Right Ventrogluteal by Diamond Nickel, RN. Patient tolerated injection well.

## 2018-09-17 ENCOUNTER — Telehealth: Payer: Self-pay

## 2018-09-17 NOTE — Telephone Encounter (Signed)
Patient has been treated with 2.4 million units of Pen G

## 2018-09-17 NOTE — Telephone Encounter (Signed)
Georgina Snell with [facility] Dept called since pt had a reactive RPR on 09/12/18. Georgina Snell request cb with plan of care to FU.

## 2018-09-17 NOTE — Telephone Encounter (Addendum)
Spoke with Georgina Snell with [facility] Dept, made aware of treatment plan for patient.  POC: Pen G 2.65mil units weekly x 3 weeks - pt has two remaining doses and is aware per Webb Silversmith, NP  Nothing further needed.

## 2018-09-22 NOTE — Telephone Encounter (Signed)
Yes, he had Treponemal and inguinal lymphadenopathy.

## 2018-09-22 NOTE — Telephone Encounter (Signed)
Mark Francis from [place] left v/m wanting to know if pt had any definite diagnosis or lab results other than the syphilis. [place] request cb.

## 2018-09-24 ENCOUNTER — Encounter: Payer: Self-pay | Admitting: Internal Medicine

## 2018-09-25 MED ORDER — DOXYCYCLINE HYCLATE 100 MG PO TABS: 100.0000 mg | ORAL_TABLET | Freq: Two times a day (BID) | ORAL | 0 refills | Status: DC

## 2018-09-29 ENCOUNTER — Ambulatory Visit (INDEPENDENT_AMBULATORY_CARE_PROVIDER_SITE_OTHER): Payer: BC Managed Care – PPO

## 2018-09-29 DIAGNOSIS — R590 Localized enlarged lymph nodes: Secondary | ICD-10-CM

## 2018-09-29 MED ORDER — PENICILLIN G BENZATHINE 1200000 UNIT/2ML IM SUSP
1.2000 10*6.[IU] | Freq: Once | INTRAMUSCULAR | Status: AC
  Administered 2018-09-29: 09:00:00 1.2 10*6.[IU] via INTRAMUSCULAR

## 2018-09-29 NOTE — Progress Notes (Signed)
Per orders of Webb Silversmith, NP, injection of Bicillin LA 2,400,000 units administered deep IM in 2 divided doses each 1.88million units, one to Left upper outer and 1 to Right upper outer by Lindalou Hose, CMA

## 2018-10-06 ENCOUNTER — Encounter: Payer: Self-pay | Admitting: Internal Medicine

## 2018-10-06 ENCOUNTER — Ambulatory Visit (INDEPENDENT_AMBULATORY_CARE_PROVIDER_SITE_OTHER): Payer: BC Managed Care – PPO | Admitting: Internal Medicine

## 2018-10-06 ENCOUNTER — Other Ambulatory Visit: Payer: Self-pay

## 2018-10-06 VITALS — BP 126/80 | HR 81 | Temp 98.6°F | Wt 208.0 lb

## 2018-10-06 DIAGNOSIS — W57XXXD Bitten or stung by nonvenomous insect and other nonvenomous arthropods, subsequent encounter: Secondary | ICD-10-CM

## 2018-10-06 DIAGNOSIS — R590 Localized enlarged lymph nodes: Secondary | ICD-10-CM

## 2018-10-06 DIAGNOSIS — A539 Syphilis, unspecified: Secondary | ICD-10-CM

## 2018-10-06 DIAGNOSIS — R789 Finding of unspecified substance, not normally found in blood: Secondary | ICD-10-CM | POA: Diagnosis not present

## 2018-10-06 DIAGNOSIS — S70361D Insect bite (nonvenomous), right thigh, subsequent encounter: Secondary | ICD-10-CM

## 2018-10-06 DIAGNOSIS — A530 Latent syphilis, unspecified as early or late: Secondary | ICD-10-CM

## 2018-10-06 MED ORDER — PENICILLIN G BENZATHINE 1200000 UNIT/2ML IM SUSP
1.2000 10*6.[IU] | Freq: Once | INTRAMUSCULAR | Status: AC
  Administered 2018-10-06: 1.2 10*6.[IU] via INTRAMUSCULAR

## 2018-10-06 NOTE — Progress Notes (Signed)
Subjective:    Patient ID: Mark Francis, male    DOB: 02/03/1976, 43 y.o.   MRN: [id number]  HPI  Patient presents to the clinic today for a follow up for recent diagnosis of syphilis. He reports the lymph nodes in the groin are no longer swollen. He has some intermittent pain in the RLQ but not severe. He denies urinary symptoms. He has had intermittent pain behind his testicles but none now. He has never had prostate problems. He has finished 2 rounds of Doxycycline, has had 2 doses of Pen G, due for his last dose today. His main question is this a false positive RPR/treponemal or is this related to tick bite.  Review of Systems  Past Medical History:  Diagnosis Date  . Allergy     Current Outpatient Medications  Medication Sig Dispense Refill  . acetaminophen (TYLENOL) 500 MG tablet Take 500 mg by mouth every 6 (six) hours as needed.    . doxycycline (VIBRA-TABS) 100 MG tablet Take 1 tablet (100 mg total) by mouth 2 (two) times daily. 20 tablet 0  . fluticasone (FLONASE) 50 MCG/ACT nasal spray Place 2 sprays into both nostrils daily. 16 g 6  . ibuprofen (ADVIL,MOTRIN) 200 MG tablet Take 200 mg by mouth every 6 (six) hours as needed.     No current facility-administered medications for this visit.     Allergies  Allergen Reactions  . Benzonatate Hives  . Dextromethorphan Hbr Hives    Family History  Problem Relation Age of Onset  . Cancer Paternal Uncle        testicular  . Cancer Paternal Grandfather        lyphoma  . Diabetes Neg Hx   . Early death Neg Hx   . Hypertension Neg Hx   . Hyperlipidemia Neg Hx   . Stroke Neg Hx     Social History   Socioeconomic History  . Marital status: Married    Spouse name: Not on file  . Number of children: Not on file  . Years of education: Not on file  . Highest education level: Not on file  Occupational History  . Not on file  Social Needs  . Financial resource strain: Not on file  . Food insecurity    Worry: Not on  file    Inability: Not on file  . Transportation needs    Medical: Not on file    Non-medical: Not on file  Tobacco Use  . Smoking status: Never Smoker  . Smokeless tobacco: Never Used  Substance and Sexual Activity  . Alcohol use: Yes    Comment: occasional  . Drug use: No  . Sexual activity: Yes  Lifestyle  . Physical activity    Days per week: Not on file    Minutes per session: Not on file  . Stress: Not on file  Relationships  . Social Herbalist on phone: Not on file    Gets together: Not on file    Attends religious service: Not on file    Active member of club or organization: Not on file    Attends meetings of clubs or organizations: Not on file    Relationship status: Not on file  . Intimate partner violence    Fear of current or ex partner: Not on file    Emotionally abused: Not on file    Physically abused: Not on file    Forced sexual activity: Not on file  Other  Topics Concern  . Not on file  Social History Narrative  . Not on file     Constitutional: Denies fever, malaise, fatigue, headache or abrupt weight changes.  Respiratory: Denies difficulty breathing, shortness of breath, cough or sputum production.   Cardiovascular: Denies chest pain, chest tightness, palpitations or swelling in the hands or feet.  Gastrointestinal: Pt reports intermittent RLQ pain. Denies bloating, constipation, diarrhea or blood in the stool.  GU: Pt reports intermittent pain behind testicles. Denies urgency, frequency, pain with urination, burning sensation, blood in urine, odor or discharge. Musculoskeletal: Denies decrease in range of motion, difficulty with gait, muscle pain or joint pain and swelling.  Skin: Denies redness, rashes, lesions or ulcercations.   No other specific complaints in a complete review of systems (except as listed in HPI above).     Objective:   Physical Exam   BP 126/80   Pulse 81   Temp 98.6 F (37 C) (Temporal)   Wt 208 lb (94.3  kg)   SpO2 98%   BMI 29.84 kg/m   Wt Readings from Last 3 Encounters:  09/12/18 203 lb (92.1 kg)  08/28/18 203 lb (92.1 kg)  08/12/17 199 lb 12 oz (90.6 kg)    General: Appears his stated age, well developed, well nourished in NAD. Skin: Warm, dry and intact. No rashes noted. Cardiovascular: Normal rate and rhythm. S1,S2 noted.  No murmur, rubs or gallops noted.  Pulmonary/Chest: Normal effort and positive vesicular breath sounds. No respiratory distress. No wheezes, rales or ronchi noted.  Abdomen: Soft and nontender. Normal bowel sounds. No distention or masses noted.  Neurological: Alert and oriented.    BMET    Component Value Date/Time   NA 137 08/28/2018 0932   K 4.7 08/28/2018 0932   CL 102 08/28/2018 0932   CO2 28 08/28/2018 0932   GLUCOSE 95 08/28/2018 0932   BUN 12 08/28/2018 0932   CREATININE 0.94 08/28/2018 0932   CREATININE 0.91 12/09/2015 1559   CALCIUM 8.6 08/28/2018 0932    Lipid Panel     Component Value Date/Time   CHOL 182 08/12/2017 1600   TRIG 92.0 08/12/2017 1600   HDL 55.70 08/12/2017 1600   CHOLHDL 3 08/12/2017 1600   VLDL 18.4 08/12/2017 1600   LDLCALC 108 (H) 08/12/2017 1600    CBC    Component Value Date/Time   WBC 3.9 (L) 08/28/2018 0932   RBC 5.23 08/28/2018 0932   HGB 14.8 08/28/2018 0932   HCT 43.2 08/28/2018 0932   PLT 152.0 08/28/2018 0932   MCV 82.7 08/28/2018 0932   MCH 27.5 12/09/2015 1559   MCHC 34.2 08/28/2018 0932   RDW 13.4 08/28/2018 0932   LYMPHSABS 1,988 12/09/2015 1559   MONOABS 497 12/09/2015 1559   EOSABS 142 12/09/2015 1559   BASOSABS 71 12/09/2015 1559    Hgb A1C Lab Results  Component Value Date   HGBA1C 5.5 08/12/2017           Assessment & Plan:   Positive RPR, Positive Treponemal, Recent Tick Bite:  Advised him that I did not know if this is r/t tick bite Offered referral to ID for further evaluation, he declines at this time Pen G 2.4 million units given today Health dept has been in  contact with pt  Return precautions discussed Webb Silversmith, NP

## 2018-10-07 ENCOUNTER — Encounter: Payer: Self-pay | Admitting: Internal Medicine

## 2018-10-07 NOTE — Patient Instructions (Signed)
Syphilis Syphilis is an infection that can spread through sexual contact. The infection can cause serious complications, so it is important to get treatment right away. There are four stages of syphilis:  Primary stage. During this stage sores may form where the disease entered your body.  Secondary stage. During this stage skin rashes and lesions will form.  Latent stage. During this stage there are no symptoms, but the infection may still be contagious.  Tertiary stage. This stage happens 10-30 years after the infection starts. During this stage, the disease damages organs and can lead to death. Most people do not develop this stage of syphilis. What are the causes? This condition is caused by bacteria called Treponema pallidum. The condition can spread during sexual activity, such as during oral, anal, or vaginal sex. It can also be spread from mother to fetus during pregnancy. What increases the risk? You are more likely to develop this condition if:  You do not use a condom.  You have or had another sexually transmitted infection (STI).  You have multiple sex partners.  You use illegal drugs through an IV. What are the signs or symptoms? Symptoms of this condition depend on the stage of the disease. Primary stage  Painless sores (chancres) in and around the genital organs, mouth, or hands. The sores are usually firm and round. Secondary stage  A rash or sores. The rash usually does not itch.  A fever.  A headache.  A sore throat.  Swollen lymph nodes.  New sores in the mouth or on the genitals.  A feeling of being ill.  Pain in the joints.  Patchy hair loss.  Weight loss.  Fatigue. Latent stage There are no symptoms during this stage. Tertiary stage  Dementia.  Personality and mood changes.  Difficulty walking and coordinating movements.  Muscle weakness or paralysis.  Problems with coordination.  Heart failure.  Trouble breathing.  Fainting.   Soft, rubbery growths on the skin, bones, or liver (gummas).  Sudden "lightning" pains, numbness, or tingling.  Vision changes.  Hearing changes.  Trouble controlling your urine and bowel movements. How is this diagnosed? This condition is diagnosed with:  A physical exam.  Blood tests.  Tests of the the fluid (drainage) from a sore or rash.  Tests of the fluid around the spine (lumbar puncture). These tests are done to check for an infection in the brain or nervous system.  Imaging tests. These may be done to check for damage to the heart, aorta, or brain if the condition is in the tertiary stage. Tests may include: ? An X-ray. ? A CT scan. ? An MRI. ? An echocardiogram. This test takes a picture of the heart. ? An ultrasound. How is this treated? This condition can be cured with antibiotic medicine. During the first day of treatment, the medicine may cause you to experience fever, chills, headache, nausea, or aching all over your body. This is normal and should go away within 24 hours. Follow these instructions at home: Medicines   Take over-the-counter and prescription medicines only as told by your health care provider.  Take your antibiotic medicine as told by your health care provider. Do not stop taking the antibiotic even if you start to feel better. Incomplete treatment will put you at risk for continued infection and could be life threatening. General instructions  Do not have sex until your treatment is completed, or as directed by your health care provider.  Tell your recent sexual partners that  you were diagnosed with syphilis. It is important that they get treatment, even if they do not have symptoms.  Keep all follow-up visits as told by your health care provider. This is important. How is this prevented?  Use latex condoms correctly whenever you have sex.  Before you have sex, ask your partner if he or she has been tested for STIs. Ask about the test  results.  Avoid having multiple sexual partners. Contact a health care provider if:  You continue to have any of the following symptoms 24 hours after beginning treatment: ? Fever. ? Chills. ? Headache. ? Nausea. ? Aching all over your body.  Your symptoms do not improve, even with treatment. Get help right away if:  You have severe chest pain.  You have trouble walking or coordinating movements.  You are confused.  You lose vision or hearing.  You have numbness in your arms or legs.  You have a seizure.  You faint.  You have a severe headache that does not go away with medicine. Summary  Syphilis is an infection that can spread through sexual contact.  This condition can cause serious complications, so it is best to get treatment right away. The condition can be cured with antibiotic medicine.  This condition can also be spread from mother to fetus during pregnancy.  Take your antibiotic medicine as told by your health care provider.  Tell your recent sexual partners that you were diagnosed with syphilis. It is important that they get treatment, even if they do not have symptoms. This information is not intended to replace advice given to you by your health care provider. Make sure you discuss any questions you have with your health care provider. Document Released: 12/10/2012 Document Revised: 04/01/2018 Document Reviewed: 04/17/2016 Elsevier Patient Education  2020 [employer].

## 2018-10-10 ENCOUNTER — Encounter: Payer: Self-pay | Admitting: Internal Medicine

## 2018-10-13 ENCOUNTER — Ambulatory Visit (INDEPENDENT_AMBULATORY_CARE_PROVIDER_SITE_OTHER): Payer: BC Managed Care – PPO | Admitting: Internal Medicine

## 2018-10-13 ENCOUNTER — Other Ambulatory Visit: Payer: Self-pay

## 2018-10-13 ENCOUNTER — Encounter: Payer: Self-pay | Admitting: Internal Medicine

## 2018-10-13 VITALS — BP 122/82 | HR 69 | Temp 98.0°F | Ht 70.0 in | Wt 208.0 lb

## 2018-10-13 DIAGNOSIS — R102 Pelvic and perineal pain: Secondary | ICD-10-CM

## 2018-10-13 DIAGNOSIS — N3943 Post-void dribbling: Secondary | ICD-10-CM

## 2018-10-13 DIAGNOSIS — R1030 Lower abdominal pain, unspecified: Secondary | ICD-10-CM

## 2018-10-13 DIAGNOSIS — N401 Enlarged prostate with lower urinary tract symptoms: Secondary | ICD-10-CM | POA: Diagnosis not present

## 2018-10-13 DIAGNOSIS — R3911 Hesitancy of micturition: Secondary | ICD-10-CM

## 2018-10-13 DIAGNOSIS — N400 Enlarged prostate without lower urinary tract symptoms: Secondary | ICD-10-CM

## 2018-10-13 LAB — PSA: PSA: 0.68 ng/mL (ref 0.10–4.00)

## 2018-10-13 NOTE — Patient Instructions (Signed)

## 2018-10-13 NOTE — Progress Notes (Signed)
Subjective:    Patient ID: Mark Francis, male    DOB: 02-24-1976, 43 y.o.   MRN: [id number]  HPI  Pt presents to the clinic today with c/o inguinal and perineal pain. This has been intermittent over the last month but seemed more consistent over the last few days. He describes the pain as aching The pain does not radiate. He reports some urinary hesitancy and intermittent dribbling. He denies abdominal pain, low back pain, urgency, frequency or dysuria. He denies penile discharge, scrotal swelling or lesions. He was recently treated with 3 doses of Pen G for a positive RPR/Treponemal, although there is some concern that that was a false positive secondary to a recent tick bite. He has also be on Doxycycline for the tick bite. He has not taken anything OTC for this. He denies family history of prostate cancer.   Review of Systems      Past Medical History:  Diagnosis Date  . Allergy     Current Outpatient Medications  Medication Sig Dispense Refill  . acetaminophen (TYLENOL) 500 MG tablet Take 500 mg by mouth every 6 (six) hours as needed.    . fluticasone (FLONASE) 50 MCG/ACT nasal spray Place 2 sprays into both nostrils daily. 16 g 6  . ibuprofen (ADVIL,MOTRIN) 200 MG tablet Take 200 mg by mouth every 6 (six) hours as needed.     No current facility-administered medications for this visit.     Allergies  Allergen Reactions  . Benzonatate Hives  . Dextromethorphan Hbr Hives    Family History  Problem Relation Age of Onset  . Cancer Paternal Uncle        testicular  . Cancer Paternal Grandfather        lyphoma  . Diabetes Neg Hx   . Early death Neg Hx   . Hypertension Neg Hx   . Hyperlipidemia Neg Hx   . Stroke Neg Hx     Social History   Socioeconomic History  . Marital status: Married    Spouse name: Not on file  . Number of children: Not on file  . Years of education: Not on file  . Highest education level: Not on file  Occupational History  . Not on file   Social Needs  . Financial resource strain: Not on file  . Food insecurity    Worry: Not on file    Inability: Not on file  . Transportation needs    Medical: Not on file    Non-medical: Not on file  Tobacco Use  . Smoking status: Never Smoker  . Smokeless tobacco: Never Used  Substance and Sexual Activity  . Alcohol use: Yes    Comment: occasional  . Drug use: No  . Sexual activity: Yes  Lifestyle  . Physical activity    Days per week: Not on file    Minutes per session: Not on file  . Stress: Not on file  Relationships  . Social Herbalist on phone: Not on file    Gets together: Not on file    Attends religious service: Not on file    Active member of club or organization: Not on file    Attends meetings of clubs or organizations: Not on file    Relationship status: Not on file  . Intimate partner violence    Fear of current or ex partner: Not on file    Emotionally abused: Not on file    Physically abused: Not on file  Forced sexual activity: Not on file  Other Topics Concern  . Not on file  Social History Narrative  . Not on file     Constitutional: Denies fever, malaise, fatigue, headache or abrupt weight changes.  Respiratory: Denies difficulty breathing, shortness of breath, cough or sputum production.   Cardiovascular: Denies chest pain, chest tightness, palpitations or swelling in the hands or feet.  Gastrointestinal: Denies abdominal pain, bloating, constipation, diarrhea or blood in the stool.  GU: Pt reports inguinal and perineal pain. Denies urgency, frequency, pain with urination, burning sensation, blood in urine, odor or discharge. Skin: Denies redness, rashes, lesions or ulcercations.   No other specific complaints in a complete review of systems (except as listed in HPI above).  Objective:   Physical Exam  BP 122/82   Pulse 69   Temp 98 F (36.7 C) (Temporal)   Ht 5\' 10"  (1.778 m)   Wt 208 lb (94.3 kg)   SpO2 98%   BMI 29.84  kg/m  Wt Readings from Last 3 Encounters:  10/13/18 208 lb (94.3 kg)  10/06/18 208 lb (94.3 kg)  09/12/18 203 lb (92.1 kg)    General: Appears his stated age, well developed, well nourished in NAD. Skin: Warm, dry and intact. No rashesnoted. Cardiovascular: Normal rate and rhythm. S1,S2 noted.  No murmur, rubs or gallops noted.  Pulmonary/Chest: Normal effort and positive vesicular breath sounds. No respiratory distress. No wheezes, rales or ronchi noted.  Abdomen: Soft and nontender. Normal bowel sounds. No distention or masses noted.  GU: No noted inguinal lymphadenopathy note. No direct inguinal hernia. No scrotal swelling or mass noted. Normal male anatomy. Rectal: Prostate enlarged, slightly firm without masses.  Neurological: Alert and oriented.   BMET    Component Value Date/Time   NA 137 08/28/2018 0932   K 4.7 08/28/2018 0932   CL 102 08/28/2018 0932   CO2 28 08/28/2018 0932   GLUCOSE 95 08/28/2018 0932   BUN 12 08/28/2018 0932   CREATININE 0.94 08/28/2018 0932   CREATININE 0.91 12/09/2015 1559   CALCIUM 8.6 08/28/2018 0932    Lipid Panel     Component Value Date/Time   CHOL 182 08/12/2017 1600   TRIG 92.0 08/12/2017 1600   HDL 55.70 08/12/2017 1600   CHOLHDL 3 08/12/2017 1600   VLDL 18.4 08/12/2017 1600   LDLCALC 108 (H) 08/12/2017 1600    CBC    Component Value Date/Time   WBC 3.9 (L) 08/28/2018 0932   RBC 5.23 08/28/2018 0932   HGB 14.8 08/28/2018 0932   HCT 43.2 08/28/2018 0932   PLT 152.0 08/28/2018 0932   MCV 82.7 08/28/2018 0932   MCH 27.5 12/09/2015 1559   MCHC 34.2 08/28/2018 0932   RDW 13.4 08/28/2018 0932   LYMPHSABS 1,988 12/09/2015 1559   MONOABS 497 12/09/2015 1559   EOSABS 142 12/09/2015 1559   BASOSABS 71 12/09/2015 1559    Hgb A1C Lab Results  Component Value Date   HGBA1C 5.5 08/12/2017            Assessment & Plan:   Perineal Pain, Inguinal Pain, BPH, Urinary Hesitancy, Dribbling:  Urinalysis and urine culture  reviewed, he was having these symptoms at that time and has been on multiple rounds of abx, so prostatitis is low on my differential diagnosis PSA today Consider Flomax vs urology consult pending labs  Will follow up after labs, return precautions discussed Webb Silversmith, NP

## 2018-11-07 DIAGNOSIS — N400 Enlarged prostate without lower urinary tract symptoms: Secondary | ICD-10-CM | POA: Diagnosis not present

## 2019-01-01 DIAGNOSIS — Z20828 Contact with and (suspected) exposure to other viral communicable diseases: Secondary | ICD-10-CM | POA: Diagnosis not present

## 2019-01-08 ENCOUNTER — Encounter: Payer: Self-pay | Admitting: Internal Medicine

## 2019-01-09 ENCOUNTER — Encounter: Payer: Self-pay | Admitting: Internal Medicine

## 2019-01-09 ENCOUNTER — Ambulatory Visit (INDEPENDENT_AMBULATORY_CARE_PROVIDER_SITE_OTHER): Payer: BC Managed Care – PPO | Admitting: Internal Medicine

## 2019-01-09 ENCOUNTER — Other Ambulatory Visit: Payer: Self-pay

## 2019-01-09 VITALS — BP 120/82 | HR 82 | Temp 98.5°F | Wt 206.0 lb

## 2019-01-09 DIAGNOSIS — R599 Enlarged lymph nodes, unspecified: Secondary | ICD-10-CM | POA: Diagnosis not present

## 2019-01-09 DIAGNOSIS — Z113 Encounter for screening for infections with a predominantly sexual mode of transmission: Secondary | ICD-10-CM | POA: Diagnosis not present

## 2019-01-09 DIAGNOSIS — R1031 Right lower quadrant pain: Secondary | ICD-10-CM

## 2019-01-09 NOTE — Addendum Note (Signed)
Addended by: Ellamae Sia on: 01/09/2019 04:11 PM   Modules accepted: Orders

## 2019-01-09 NOTE — Progress Notes (Signed)
Subjective:    Patient ID: Mark Francis, male    DOB: 19-Jul-1975, 43 y.o.   MRN: [id number]  HPI  Pt presents to the clinic today for follow-up of his intermittent lower back pain and RLQ intermittent abdominal pain. This started 1 week ago. He describes the back pain as intermittent, sore and achy. He describes the right lower quadrant pain as intermittent, sharp pressure. The pain is worse with bending over. He denies nausea, vomiting, constipation, diarrhea or blood in his stool. He denies urinary urgency, frequency, dysuria or blood in his urine. He denies penile lesion, discharge, itching, irritation or testicular pain or swelling. He has had intermittent right inguinal lymphadenopathy secondary to Lyme disease, but was also treated for a false positive RPR. PSA 0.68 which is within normal range on 08/20. He has not taken anything OTC for this.   Review of Systems      Past Medical History:  Diagnosis Date  . Allergy     Current Outpatient Medications  Medication Sig Dispense Refill  . acetaminophen (TYLENOL) 500 MG tablet Take 500 mg by mouth every 6 (six) hours as needed.    . fluticasone (FLONASE) 50 MCG/ACT nasal spray Place 2 sprays into both nostrils daily. 16 g 6  . ibuprofen (ADVIL,MOTRIN) 200 MG tablet Take 200 mg by mouth every 6 (six) hours as needed.     No current facility-administered medications for this visit.     Allergies  Allergen Reactions  . Benzonatate Hives  . Dextromethorphan Hbr Hives    Family History  Problem Relation Age of Onset  . Cancer Paternal Uncle        testicular  . Cancer Paternal Grandfather        lyphoma  . Diabetes Neg Hx   . Early death Neg Hx   . Hypertension Neg Hx   . Hyperlipidemia Neg Hx   . Stroke Neg Hx     Social History   Socioeconomic History  . Marital status: Married    Spouse name: Not on file  . Number of children: Not on file  . Years of education: Not on file  . Highest education level: Not on file   Occupational History  . Not on file  Social Needs  . Financial resource strain: Not on file  . Food insecurity    Worry: Not on file    Inability: Not on file  . Transportation needs    Medical: Not on file    Non-medical: Not on file  Tobacco Use  . Smoking status: Never Smoker  . Smokeless tobacco: Never Used  Substance and Sexual Activity  . Alcohol use: Yes    Comment: occasional  . Drug use: No  . Sexual activity: Yes  Lifestyle  . Physical activity    Days per week: Not on file    Minutes per session: Not on file  . Stress: Not on file  Relationships  . Social Herbalist on phone: Not on file    Gets together: Not on file    Attends religious service: Not on file    Active member of club or organization: Not on file    Attends meetings of clubs or organizations: Not on file    Relationship status: Not on file  . Intimate partner violence    Fear of current or ex partner: Not on file    Emotionally abused: Not on file    Physically abused: Not on file  Forced sexual activity: Not on file  Other Topics Concern  . Not on file  Social History Narrative  . Not on file     Constitutional: Denies fever, malaise, fatigue, headache or abrupt weight changes.  Respiratory: Denies difficulty breathing, shortness of breath, cough or sputum production.   Cardiovascular: Denies chest pain, chest tightness, palpitations or swelling in the hands or feet.  Gastrointestinal: Pt reports right inguinal pain. Denies abdominal pain, bloating, constipation, diarrhea or blood in the stool.  GU: Denies urgency, frequency, pain with urination, burning sensation, blood in urine, odor or discharge. Musculoskeletal: Pt reports right low back pain. Denies decrease in range of motion, difficulty with gait, or joint pain and swelling.  Skin: Denies redness, rashes, lesions or ulcercations.    No other specific complaints in a complete review of systems (except as listed in HPI  above).  Objective:   Physical Exam  BP 120/82   Pulse 82   Temp 98.5 F (36.9 C) (Temporal)   Wt 206 lb (93.4 kg)   SpO2 96%   BMI 29.56 kg/m   Wt Readings from Last 3 Encounters:  10/13/18 208 lb (94.3 kg)  10/06/18 208 lb (94.3 kg)  09/12/18 203 lb (92.1 kg)    General: Appears his stated age, well developed, well nourished in NAD. Skin: Warm, dry and intact. No rashes noted. No recurrent right inguinal lymphadenopathy noted.  Cardiovascular: Normal rate and rhythm. S1,S2 noted.  No murmur, rubs or gallops noted. Pulmonary/Chest: Normal effort and positive vesicular breath sounds. No respiratory distress. No wheezes, rales or ronchi noted.  Abdomen: Soft and nontender. Normal bowel sounds. No distention or masses noted. GU: No palpable right inguinal hernia noted.  Musculoskeletal: Normal abduction, adduction, internal and external rotation of the right hip. No pain with palpation of the right hip. No difficulty with gait.    BMET    Component Value Date/Time   NA 137 08/28/2018 0932   K 4.7 08/28/2018 0932   CL 102 08/28/2018 0932   CO2 28 08/28/2018 0932   GLUCOSE 95 08/28/2018 0932   BUN 12 08/28/2018 0932   CREATININE 0.94 08/28/2018 0932   CREATININE 0.91 12/09/2015 1559   CALCIUM 8.6 08/28/2018 0932    Lipid Panel     Component Value Date/Time   CHOL 182 08/12/2017 1600   TRIG 92.0 08/12/2017 1600   HDL 55.70 08/12/2017 1600   CHOLHDL 3 08/12/2017 1600   VLDL 18.4 08/12/2017 1600   LDLCALC 108 (H) 08/12/2017 1600    CBC    Component Value Date/Time   WBC 3.9 (L) 08/28/2018 0932   RBC 5.23 08/28/2018 0932   HGB 14.8 08/28/2018 0932   HCT 43.2 08/28/2018 0932   PLT 152.0 08/28/2018 0932   MCV 82.7 08/28/2018 0932   MCH 27.5 12/09/2015 1559   MCHC 34.2 08/28/2018 0932   RDW 13.4 08/28/2018 0932   LYMPHSABS 1,988 12/09/2015 1559   MONOABS 497 12/09/2015 1559   EOSABS 142 12/09/2015 1559   BASOSABS 71 12/09/2015 1559    Hgb A1C Lab Results   Component Value Date   HGBA1C 5.5 08/12/2017           Assessment & Plan:   Right Inguinal Lymphadenopathy, Right Inguinal Pain, Screen for STD:  Will check HIV, RPR, Treponemal today Will obtain CT abd/pelvis for further evaluation  History of Lyme Disease:  Repeat B Burgdorferi today  Return precautions discussed, will follow up after labs Webb Silversmith, NP

## 2019-01-09 NOTE — Patient Instructions (Signed)
Inguinal Hernia, Adult An inguinal hernia is when fat or your intestines push through a weak spot in a muscle where your leg meets your lower belly (groin). This causes a rounded lump (bulge). This kind of hernia could also be:  In your scrotum, if you are male.  In folds of skin around your vagina, if you are male. There are three types of inguinal hernias. These include:  Hernias that can be pushed back into the belly (are reducible). This type rarely causes pain.  Hernias that cannot be pushed back into the belly (are incarcerated).  Hernias that cannot be pushed back into the belly and lose their blood supply (are strangulated). This type needs emergency surgery. If you do not have symptoms, you may not need treatment. If you have symptoms or a large hernia, you may need surgery. Follow these instructions at home: Lifestyle  Do these things if told by your doctor so you do not have trouble pooping (constipation): ? Drink enough fluid to keep your pee (urine) pale yellow. ? Eat foods that have a lot of fiber. These include fresh fruits and vegetables, whole grains, and beans. ? Limit foods that are high in fat and processed sugars. These include foods that are fried or sweet. ? Take medicine for trouble pooping.  Avoid lifting heavy objects.  Avoid standing for long amounts of time.  Do not use any products that contain nicotine or tobacco. These include cigarettes and e-cigarettes. If you need help quitting, ask your doctor.  Stay at a healthy weight. General instructions  You may try to push your hernia in by very gently pressing on it when you are lying down. Do not try to force the bulge back in if it will not push in easily.  Watch your hernia for any changes in shape, size, or color. Tell your doctor if you see any changes.  Take over-the-counter and prescription medicines only as told by your doctor.  Keep all follow-up visits as told by your doctor. This is  important. Contact a doctor if:  You have a fever.  You have new symptoms.  Your symptoms get worse. Get help right away if:  The area where your leg meets your lower belly has: ? Pain that gets worse suddenly. ? A bulge that gets bigger suddenly, and it does not get smaller after that. ? A bulge that turns red or purple. ? A bulge that is painful when you touch it.  You are a man, and your scrotum: ? Suddenly feels painful. ? Suddenly changes in size.  You cannot push the hernia in by very gently pressing on it when you are lying down. Do not try to force the bulge back in if it will not push in easily.  You feel sick to your stomach (nauseous), and that feeling does not go away.  You throw up (vomit), and that keeps happening.  You have a fast heartbeat.  You cannot poop (have a bowel movement) or pass gas. These symptoms may be an emergency. Do not wait to see if the symptoms will go away. Get medical help right away. Call your local emergency services (911 in the [place] Summary  An inguinal hernia is when fat or your intestines push through a weak spot in a muscle where your leg meets your lower belly (groin). This causes a rounded lump (bulge).  If you do not have symptoms, you may not need treatment. If you have symptoms or a large hernia, you   may need surgery.  Avoid lifting heavy objects. Also avoid standing for long amounts of time.  Do not try to force the bulge back in if it will not push in easily. This information is not intended to replace advice given to you by your health care provider. Make sure you discuss any questions you have with your health care provider. Document Released: 03/22/2006 Document Revised: 03/23/2017 Document Reviewed: 11/21/2016 Elsevier Patient Education  2020 Elsevier Inc.  

## 2019-01-13 LAB — CBC
HCT: 45.7 % (ref 38.5–50.0)
Hemoglobin: 15 g/dL (ref 13.2–17.1)
MCH: 27.4 pg (ref 27.0–33.0)
MCHC: 32.8 g/dL (ref 32.0–36.0)
MCV: 83.4 fL (ref 80.0–100.0)
MPV: 13 fL — ABNORMAL HIGH (ref 7.5–12.5)
Platelets: 240 10*3/uL (ref 140–400)
RBC: 5.48 10*6/uL (ref 4.20–5.80)
RDW: 12.9 % (ref 11.0–15.0)
WBC: 9.2 10*3/uL (ref 3.8–10.8)

## 2019-01-13 LAB — COMPREHENSIVE METABOLIC PANEL
AG Ratio: 2 (calc) (ref 1.0–2.5)
ALT: 16 U/L (ref 9–46)
AST: 16 U/L (ref 10–40)
Albumin: 4.9 g/dL (ref 3.6–5.1)
Alkaline phosphatase (APISO): 70 U/L (ref 36–130)
BUN: 18 mg/dL (ref 7–25)
CO2: 28 mmol/L (ref 20–32)
Calcium: 9.9 mg/dL (ref 8.6–10.3)
Chloride: 100 mmol/L (ref 98–110)
Creat: 1.22 mg/dL (ref 0.60–1.35)
Globulin: 2.4 g/dL (calc) (ref 1.9–3.7)
Glucose, Bld: 91 mg/dL (ref 65–99)
Potassium: 4.8 mmol/L (ref 3.5–5.3)
Sodium: 139 mmol/L (ref 135–146)
Total Bilirubin: 0.5 mg/dL (ref 0.2–1.2)
Total Protein: 7.3 g/dL (ref 6.1–8.1)

## 2019-01-13 LAB — B. BURGDORFI ANTIBODIES BY WB
B burgdorferi IgG Abs (IB): NEGATIVE
B burgdorferi IgM Abs (IB): NEGATIVE
Lyme Disease 18 kD IgG: NONREACTIVE
Lyme Disease 23 kD IgG: NONREACTIVE
Lyme Disease 23 kD IgM: NONREACTIVE
Lyme Disease 28 kD IgG: NONREACTIVE
Lyme Disease 30 kD IgG: NONREACTIVE
Lyme Disease 39 kD IgG: NONREACTIVE
Lyme Disease 39 kD IgM: NONREACTIVE
Lyme Disease 41 kD IgG: REACTIVE — AB
Lyme Disease 41 kD IgM: NONREACTIVE
Lyme Disease 45 kD IgG: NONREACTIVE
Lyme Disease 58 kD IgG: REACTIVE — AB
Lyme Disease 66 kD IgG: NONREACTIVE
Lyme Disease 93 kD IgG: NONREACTIVE

## 2019-01-13 LAB — RPR: RPR Ser Ql: NONREACTIVE

## 2019-01-13 LAB — HIV ANTIBODY (ROUTINE TESTING W REFLEX): HIV 1&2 Ab, 4th Generation: NONREACTIVE

## 2019-01-14 ENCOUNTER — Encounter: Payer: Self-pay | Admitting: Internal Medicine

## 2019-01-20 ENCOUNTER — Ambulatory Visit
Admission: RE | Admit: 2019-01-20 | Discharge: 2019-01-20 | Disposition: A | Discharge status: Home / Self Care | Payer: BC Managed Care – PPO | Source: Ambulatory Visit | Attending: Internal Medicine | Admitting: Internal Medicine

## 2019-01-20 DIAGNOSIS — R1030 Lower abdominal pain, unspecified: Secondary | ICD-10-CM | POA: Diagnosis not present

## 2019-01-20 DIAGNOSIS — R1031 Right lower quadrant pain: Secondary | ICD-10-CM

## 2019-01-20 DIAGNOSIS — R599 Enlarged lymph nodes, unspecified: Secondary | ICD-10-CM

## 2019-01-20 MED ORDER — IOPAMIDOL (ISOVUE-300) INJECTION 61%
125.0000 mL | Freq: Once | INTRAVENOUS | Status: AC | PRN
  Administered 2019-01-20: 125 mL via INTRAVENOUS

## 2019-02-11 DIAGNOSIS — Z85828 Personal history of other malignant neoplasm of skin: Secondary | ICD-10-CM | POA: Diagnosis not present

## 2019-02-11 DIAGNOSIS — D2262 Melanocytic nevi of left upper limb, including shoulder: Secondary | ICD-10-CM | POA: Diagnosis not present

## 2019-02-11 DIAGNOSIS — D225 Melanocytic nevi of trunk: Secondary | ICD-10-CM | POA: Diagnosis not present

## 2019-02-11 DIAGNOSIS — D224 Melanocytic nevi of scalp and neck: Secondary | ICD-10-CM | POA: Diagnosis not present

## 2019-07-23 ENCOUNTER — Encounter: Payer: Self-pay | Admitting: Internal Medicine

## 2019-07-23 ENCOUNTER — Other Ambulatory Visit: Payer: Self-pay

## 2019-07-23 ENCOUNTER — Ambulatory Visit (INDEPENDENT_AMBULATORY_CARE_PROVIDER_SITE_OTHER)
Admission: RE | Admit: 2019-07-23 | Discharge: 2019-07-23 | Disposition: A | Discharge status: Home / Self Care | Payer: BC Managed Care – PPO | Source: Ambulatory Visit | Attending: Internal Medicine | Admitting: Internal Medicine

## 2019-07-23 ENCOUNTER — Ambulatory Visit (INDEPENDENT_AMBULATORY_CARE_PROVIDER_SITE_OTHER): Payer: BC Managed Care – PPO | Admitting: Internal Medicine

## 2019-07-23 VITALS — BP 124/82 | HR 76 | Temp 98.3°F | Ht 69.75 in | Wt 207.0 lb

## 2019-07-23 DIAGNOSIS — M25551 Pain in right hip: Secondary | ICD-10-CM

## 2019-07-23 DIAGNOSIS — Z1152 Encounter for screening for COVID-19: Secondary | ICD-10-CM

## 2019-07-23 DIAGNOSIS — Z0000 Encounter for general adult medical examination without abnormal findings: Secondary | ICD-10-CM

## 2019-07-23 DIAGNOSIS — M1611 Unilateral primary osteoarthritis, right hip: Secondary | ICD-10-CM | POA: Diagnosis not present

## 2019-07-23 NOTE — Patient Instructions (Signed)

## 2019-07-23 NOTE — Progress Notes (Signed)
Subjective:    Patient ID: Mark Francis, male    DOB: [birth year], 44 y.o.   MRN: [id number]  HPI  Pt presents to the clinic today for his annual exam.  Pt reports right hip pain. This started a few years ago. He describes the pain as sharp and tight. The pain does not radiate. He denies numbness, tingling or weakness of the RLE. He denies any injury to the area. He has not taken anything OTC for this.  Flu: never Tetanus: ? 5 years ago Covid: never Vision Screening: as needed Dentist: biannually  Diet: He does eat meat. He consumes fruits and veggies daily. He tries to avoid fried foods. He drinks mostly water, coffee, beer 2 x week. Exercise: Farmwork  Review of Systems      Past Medical History:  Diagnosis Date  . Allergy     Current Outpatient Medications  Medication Sig Dispense Refill  . acetaminophen (TYLENOL) 500 MG tablet Take 500 mg by mouth every 6 (six) hours as needed.    . fluticasone (FLONASE) 50 MCG/ACT nasal spray Place 2 sprays into both nostrils daily. 16 g 6  . ibuprofen (ADVIL,MOTRIN) 200 MG tablet Take 200 mg by mouth every 6 (six) hours as needed.     No current facility-administered medications for this visit.    Allergies  Allergen Reactions  . Benzonatate Hives  . Dextromethorphan Hbr Hives  . Influenza Vaccines Other (See Comments)    Pt reports feeling "horrible" after receiving vaccine    Family History  Problem Relation Age of Onset  . Cancer Paternal Uncle        testicular  . Cancer Paternal Grandfather        lyphoma  . Diabetes Neg Hx   . Early death Neg Hx   . Hypertension Neg Hx   . Hyperlipidemia Neg Hx   . Stroke Neg Hx     Social History   Socioeconomic History  . Marital status: Married    Spouse name: Not on file  . Number of children: Not on file  . Years of education: Not on file  . Highest education level: Not on file  Occupational History  . Not on file  Tobacco Use  . Smoking status: Never Smoker  .  Smokeless tobacco: Never Used  Substance and Sexual Activity  . Alcohol use: Yes    Comment: occasional  . Drug use: No  . Sexual activity: Yes  Other Topics Concern  . Not on file  Social History Narrative  . Not on file   Social Determinants of Health   Financial Resource Strain:   . Difficulty of Paying Living Expenses:   Food Insecurity:   . Worried About Charity fundraiser in the Last Year:   . Arboriculturist in the Last Year:   Transportation Needs:   . Film/video editor (Medical):   Marland Kitchen Lack of Transportation (Non-Medical):   Physical Activity:   . Days of Exercise per Week:   . Minutes of Exercise per Session:   Stress:   . Feeling of Stress :   Social Connections:   . Frequency of Communication with Friends and Family:   . Frequency of Social Gatherings with Friends and Family:   . Attends Religious Services:   . Active Member of Clubs or Organizations:   . Attends Archivist Meetings:   Marland Kitchen Marital Status:   Intimate Partner Violence:   . Fear of Current or  Ex-Partner:   . Emotionally Abused:   Marland Kitchen Physically Abused:   . Sexually Abused:      Constitutional: Denies fever, malaise, fatigue, headache or abrupt weight changes.  HEENT: Denies eye pain, eye redness, ear pain, ringing in the ears, wax buildup, runny nose, nasal congestion, bloody nose, or sore throat. Respiratory: Denies difficulty breathing, shortness of breath, cough or sputum production.   Cardiovascular: Denies chest pain, chest tightness, palpitations or swelling in the hands or feet.  Gastrointestinal: Pt reports intermittent reflux. Denies abdominal pain, bloating, constipation, diarrhea or blood in the stool.  GU: Denies urgency, frequency, pain with urination, burning sensation, blood in urine, odor or discharge. Musculoskeletal: Pt reports right hip pain. Denies decrease in range of motion, difficulty with gait, muscle pain or joint swelling.  Skin: Denies redness, rashes,  lesions or ulcercations.  Neurological: Denies dizziness, difficulty with memory, difficulty with speech or problems with balance and coordination.  Psych: Denies anxiety, depression, SI/HI.  No other specific complaints in a complete review of systems (except as listed in HPI above).  Objective:   Physical Exam  BP 124/82   Pulse 76   Temp 98.3 F (36.8 C) (Temporal)   Ht 5' 9.75" (1.772 m)   Wt 207 lb (93.9 kg)   SpO2 97%   BMI 29.91 kg/m   Wt Readings from Last 3 Encounters:  01/09/19 206 lb (93.4 kg)  10/13/18 208 lb (94.3 kg)  10/06/18 208 lb (94.3 kg)    General: Appears his stated age, well developed, well nourished in NAD. Skin: Warm, dry and intact. No rashes noted. HEENT: Head: normal shape and size; Eyes: sclera white, no icterus, conjunctiva pink, PERRLA and EOMs intact;  Neck:  Neck supple, trachea midline. No masses, lumps or thyromegaly present.  Cardiovascular: Normal rate and rhythm. S1,S2 noted.  No murmur, rubs or gallops noted. No JVD or BLE edema.  Pulmonary/Chest: Normal effort and positive vesicular breath sounds. No respiratory distress. No wheezes, rales or ronchi noted.  Abdomen: Soft and nontender. Normal bowel sounds. No distention or masses noted. Liver, spleen and kidneys non palpable. Musculoskeletal: Normal abduction, adduction, internal and external rotation of the right hip. No pain with palpation of the right hip. Strength 5/5 BUE/BLE. No difficulty with gait.  Neurological: Alert and oriented. Cranial nerves II-XII grossly intact. Coordination normal.  Psychiatric: Mood and affect normal. Behavior is normal. Judgment and thought content normal.     BMET    Component Value Date/Time   NA 139 01/09/2019 1614   K 4.8 01/09/2019 1614   CL 100 01/09/2019 1614   CO2 28 01/09/2019 1614   GLUCOSE 91 01/09/2019 1614   BUN 18 01/09/2019 1614   CREATININE 1.22 01/09/2019 1614   CALCIUM 9.9 01/09/2019 1614    Lipid Panel     Component Value  Date/Time   CHOL 182 08/12/2017 1600   TRIG 92.0 08/12/2017 1600   HDL 55.70 08/12/2017 1600   CHOLHDL 3 08/12/2017 1600   VLDL 18.4 08/12/2017 1600   LDLCALC 108 (H) 08/12/2017 1600    CBC    Component Value Date/Time   WBC 9.2 01/09/2019 1614   RBC 5.48 01/09/2019 1614   HGB 15.0 01/09/2019 1614   HCT 45.7 01/09/2019 1614   PLT 240 01/09/2019 1614   MCV 83.4 01/09/2019 1614   MCH 27.4 01/09/2019 1614   MCHC 32.8 01/09/2019 1614   RDW 12.9 01/09/2019 1614   LYMPHSABS 1,988 12/09/2015 1559   MONOABS 497 12/09/2015 1559  EOSABS 142 12/09/2015 1559   BASOSABS 71 12/09/2015 1559    Hgb A1C Lab Results  Component Value Date   HGBA1C 5.5 08/12/2017           Assessment & Plan:   Preventative Health Maintenance:  Encouraged him to get a flu shot in the fall Tetanus UTD per his report He will think about the Covid vaccine Encouraged him to consume a balanced diet and exercise regimen Advised him to see an eye doctor and dentist annually Will check CBC, CMET, Lipid profile today  Right Hip Pain:  Xray right hip today  RTC in 1 year, sooner if needed Webb Silversmith, NP This visit occurred during the SARS-CoV-2 public health emergency.  Safety protocols were in place, including screening questions prior to the visit, additional usage of staff PPE, and extensive cleaning of exam room while observing appropriate contact time as indicated for disinfecting solutions.

## 2019-07-24 LAB — CBC
HCT: 42 % (ref 39.0–52.0)
Hemoglobin: 14.1 g/dL (ref 13.0–17.0)
MCHC: 33.5 g/dL (ref 30.0–36.0)
MCV: 82.9 fl (ref 78.0–100.0)
Platelets: 196 10*3/uL (ref 150.0–400.0)
RBC: 5.07 Mil/uL (ref 4.22–5.81)
RDW: 13.9 % (ref 11.5–15.5)
WBC: 8.7 10*3/uL (ref 4.0–10.5)

## 2019-07-24 LAB — COMPREHENSIVE METABOLIC PANEL
ALT: 14 U/L (ref 0–53)
AST: 17 U/L (ref 0–37)
Albumin: 4.8 g/dL (ref 3.5–5.2)
Alkaline Phosphatase: 68 U/L (ref 39–117)
BUN: 11 mg/dL (ref 6–23)
CO2: 29 mEq/L (ref 19–32)
Calcium: 9.3 mg/dL (ref 8.4–10.5)
Chloride: 101 mEq/L (ref 96–112)
Creatinine, Ser: 0.85 mg/dL (ref 0.40–1.50)
GFR: 98.36 mL/min (ref >60.00)
Glucose, Bld: 90 mg/dL (ref 70–99)
Potassium: 4.1 mEq/L (ref 3.5–5.1)
Sodium: 137 mEq/L (ref 135–145)
Total Bilirubin: 0.6 mg/dL (ref 0.2–1.2)
Total Protein: 7.3 g/dL (ref 6.0–8.3)

## 2019-07-24 LAB — LIPID PANEL
Cholesterol: 199 mg/dL (ref 0–200)
HDL: 56.5 mg/dL (ref >39.00)
LDL Cholesterol: 122 mg/dL — ABNORMAL HIGH (ref 0–99)
NonHDL: 142.36
Total CHOL/HDL Ratio: 4
Triglycerides: 102 mg/dL (ref 0.0–149.0)
VLDL: 20.4 mg/dL (ref 0.0–40.0)

## 2019-08-04 ENCOUNTER — Ambulatory Visit: Payer: BC Managed Care – PPO | Attending: Internal Medicine

## 2019-08-04 DIAGNOSIS — Z23 Encounter for immunization: Secondary | ICD-10-CM

## 2019-08-04 NOTE — Progress Notes (Signed)
   Covid-19 Vaccination Clinic  Name:  TAYGEN ROBLEDO    MRN: [id number] DOB: May 02, 1975  08/04/2019  Mr. Wakley was observed post Covid-19 immunization for 15 minutes without incident. He was provided with Vaccine Information Sheet and instruction to access the V-Safe system.   Mr. Weyer was instructed to call 911 with any severe reactions post vaccine: Marland Kitchen Difficulty breathing  . Swelling of face and throat  . A fast heartbeat  . A bad rash all over body  . Dizziness and weakness   Immunizations Administered    Name Date Dose VIS Date Route   Pfizer COVID-19 Vaccine 08/04/2019 10:12 AM 0.3 mL 04/29/2018 Intramuscular   Manufacturer: [employer], [employer]   Lot: [id number]   [address]: [id number]

## 2019-08-12 DIAGNOSIS — D225 Melanocytic nevi of trunk: Secondary | ICD-10-CM | POA: Diagnosis not present

## 2019-08-12 DIAGNOSIS — D485 Neoplasm of uncertain behavior of skin: Secondary | ICD-10-CM | POA: Diagnosis not present

## 2019-08-12 DIAGNOSIS — D2262 Melanocytic nevi of left upper limb, including shoulder: Secondary | ICD-10-CM | POA: Diagnosis not present

## 2019-08-12 DIAGNOSIS — Z85828 Personal history of other malignant neoplasm of skin: Secondary | ICD-10-CM | POA: Diagnosis not present

## 2019-08-24 ENCOUNTER — Ambulatory Visit: Payer: BC Managed Care – PPO | Attending: Internal Medicine

## 2019-08-24 DIAGNOSIS — Z23 Encounter for immunization: Secondary | ICD-10-CM

## 2019-08-24 NOTE — Progress Notes (Signed)
   Covid-19 Vaccination Clinic  Name:  Mark Francis    MRN: [id number] DOB: 21-May-1975  08/24/2019  Mr. Berrie was observed post Covid-19 immunization for 30 minutes based on pre-vaccination screening without incident. He was provided with Vaccine Information Sheet and instruction to access the V-Safe system.   Mr. Hrdlicka was instructed to call 911 with any severe reactions post vaccine: Marland Kitchen Difficulty breathing  . Swelling of face and throat  . A fast heartbeat  . A bad rash all over body  . Dizziness and weakness   Immunizations Administered    Name Date Dose VIS Date Route   Pfizer COVID-19 Vaccine 08/24/2019  2:30 PM 0.3 mL 04/29/2018 Intramuscular   Manufacturer: [place]   Lot: [id number]   [place]: [id number]

## 2019-08-31 ENCOUNTER — Ambulatory Visit: Payer: BC Managed Care – PPO

## 2020-02-09 DIAGNOSIS — D485 Neoplasm of uncertain behavior of skin: Secondary | ICD-10-CM | POA: Diagnosis not present

## 2020-02-09 DIAGNOSIS — D224 Melanocytic nevi of scalp and neck: Secondary | ICD-10-CM | POA: Diagnosis not present

## 2020-02-09 DIAGNOSIS — D225 Melanocytic nevi of trunk: Secondary | ICD-10-CM | POA: Diagnosis not present

## 2020-02-09 DIAGNOSIS — D2262 Melanocytic nevi of left upper limb, including shoulder: Secondary | ICD-10-CM | POA: Diagnosis not present

## 2020-02-09 DIAGNOSIS — D2261 Melanocytic nevi of right upper limb, including shoulder: Secondary | ICD-10-CM | POA: Diagnosis not present

## 2020-03-04 DIAGNOSIS — H5212 Myopia, left eye: Secondary | ICD-10-CM | POA: Diagnosis not present

## 2020-04-04 DIAGNOSIS — L988 Other specified disorders of the skin and subcutaneous tissue: Secondary | ICD-10-CM | POA: Diagnosis not present

## 2020-04-04 DIAGNOSIS — D485 Neoplasm of uncertain behavior of skin: Secondary | ICD-10-CM | POA: Diagnosis not present

## 2020-04-15 ENCOUNTER — Encounter: Payer: BC Managed Care – PPO | Admitting: Internal Medicine

## 2020-05-11 DIAGNOSIS — M79642 Pain in left hand: Secondary | ICD-10-CM | POA: Diagnosis not present

## 2020-07-27 ENCOUNTER — Encounter: Payer: BC Managed Care – PPO | Admitting: Internal Medicine

## 2020-08-26 ENCOUNTER — Encounter: Payer: Self-pay | Admitting: Family Medicine

## 2020-08-26 ENCOUNTER — Other Ambulatory Visit: Payer: Self-pay

## 2020-08-26 ENCOUNTER — Ambulatory Visit (INDEPENDENT_AMBULATORY_CARE_PROVIDER_SITE_OTHER): Payer: BC Managed Care – PPO | Admitting: Family Medicine

## 2020-08-26 VITALS — BP 126/68 | HR 82 | Temp 98.0°F | Resp 16 | Ht 71.0 in | Wt 212.0 lb

## 2020-08-26 DIAGNOSIS — Z136 Encounter for screening for cardiovascular disorders: Secondary | ICD-10-CM | POA: Diagnosis not present

## 2020-08-26 DIAGNOSIS — N451 Epididymitis: Secondary | ICD-10-CM

## 2020-08-26 DIAGNOSIS — Z13220 Encounter for screening for lipoid disorders: Secondary | ICD-10-CM | POA: Diagnosis not present

## 2020-08-26 DIAGNOSIS — R1031 Right lower quadrant pain: Secondary | ICD-10-CM | POA: Diagnosis not present

## 2020-08-26 DIAGNOSIS — Z0001 Encounter for general adult medical examination with abnormal findings: Secondary | ICD-10-CM

## 2020-08-26 DIAGNOSIS — R399 Unspecified symptoms and signs involving the genitourinary system: Secondary | ICD-10-CM | POA: Diagnosis not present

## 2020-08-26 DIAGNOSIS — Z0000 Encounter for general adult medical examination without abnormal findings: Secondary | ICD-10-CM

## 2020-08-26 LAB — COMPLETE METABOLIC PANEL WITH GFR
AG Ratio: 2.1 (calc) (ref 1.0–2.5)
ALT: 23 U/L (ref 9–46)
AST: 21 U/L (ref 10–40)
Albumin: 4.8 g/dL (ref 3.6–5.1)
Alkaline phosphatase (APISO): 76 U/L (ref 36–130)
BUN: 15 mg/dL (ref 7–25)
CO2: 28 mmol/L (ref 20–32)
Calcium: 9.4 mg/dL (ref 8.6–10.3)
Chloride: 102 mmol/L (ref 98–110)
Creat: 0.87 mg/dL (ref 0.60–1.35)
GFR, Est African American: 122 mL/min/{1.73_m2} (ref >=60)
GFR, Est Non African American: 105 mL/min/{1.73_m2} (ref >=60)
Globulin: 2.3 g/dL (calc) (ref 1.9–3.7)
Glucose, Bld: 91 mg/dL (ref 65–99)
Potassium: 4.5 mmol/L (ref 3.5–5.3)
Sodium: 139 mmol/L (ref 135–146)
Total Bilirubin: 0.6 mg/dL (ref 0.2–1.2)
Total Protein: 7.1 g/dL (ref 6.1–8.1)

## 2020-08-26 LAB — CBC WITH DIFFERENTIAL/PLATELET
Absolute Monocytes: 656 cells/uL (ref 200–950)
Basophils Absolute: 47 cells/uL (ref 0–200)
Basophils Relative: 0.6 %
Eosinophils Absolute: 87 cells/uL (ref 15–500)
Eosinophils Relative: 1.1 %
HCT: 45.3 % (ref 38.5–50.0)
Hemoglobin: 14.8 g/dL (ref 13.2–17.1)
Lymphs Abs: 2133 cells/uL (ref 850–3900)
MCH: 27.5 pg (ref 27.0–33.0)
MCHC: 32.7 g/dL (ref 32.0–36.0)
MCV: 84 fL (ref 80.0–100.0)
MPV: 12.2 fL (ref 7.5–12.5)
Monocytes Relative: 8.3 %
Neutro Abs: 4977 cells/uL (ref 1500–7800)
Neutrophils Relative %: 63 %
Platelets: 221 10*3/uL (ref 140–400)
RBC: 5.39 10*6/uL (ref 4.20–5.80)
RDW: 13.1 % (ref 11.0–15.0)
Total Lymphocyte: 27 %
WBC: 7.9 10*3/uL (ref 3.8–10.8)

## 2020-08-26 LAB — PSA: PSA: 0.57 ng/mL (ref <=4.00)

## 2020-08-26 LAB — LIPID PANEL
Cholesterol: 223 mg/dL — ABNORMAL HIGH (ref <200)
HDL: 63 mg/dL (ref >=40)
LDL Cholesterol (Calc): 140 mg/dL (calc) — ABNORMAL HIGH
Non-HDL Cholesterol (Calc): 160 mg/dL (calc) — ABNORMAL HIGH (ref <130)
Total CHOL/HDL Ratio: 3.5 (calc) (ref <5.0)
Triglycerides: 94 mg/dL (ref <150)

## 2020-08-26 MED ORDER — DOXYCYCLINE HYCLATE 100 MG PO TABS: 100.0000 mg | ORAL_TABLET | Freq: Two times a day (BID) | ORAL | 0 refills | Status: DC

## 2020-08-26 NOTE — Progress Notes (Signed)
Subjective:    Patient ID: Mark Francis, male    DOB: 11-Aug-1975, 45 y.o.   MRN: [id number]  HPI  Patient is a very pleasant 45 year old Caucasian male here today to establish care.  Patient also reports a 1-1/2-week history of pain in his right testicle.  He has a history of epididymitis in college.  He states that over the last week and a half he has developed pain in his right testicle.  He is tender to touch on the right testicle.  He states that around the same time, he also has been doing a lot of heavy lifting around his [place].  His son also accidentally kicked him in that area while horse playing.  He also has some lower urinary tract symptoms as well as hesitancy.  He denies any dysuria or hematuria or frequency or urgency.  He denies any fevers or chills.  He has a history of inguinal hernia repair as a child.  He states that he went to surgery and was "cut open".  However during surgery they did not discover any hernia and therefore they simply "sewed him back up".  However there could be some residual scar tissue in that area from his previous surgery.  He denies any pain in his back although the pain does radiate from his right inguinal area into his right lower pelvis.  On exam today, he is exquisitely tender to palpation around the epididymitis along the right testicle.  There is no palpable hernia.  There is no hernia palpated on Valsalva.  There is no visible rash or lesions or erythema or bulge.  Differential diagnosis includes testicular pain due to nerve irritation versus epididymitis.  Otherwise he is doing well.  He does request a PSA to screen for prostate cancer despite not yet being age recommended.  He denies any family history of colon cancer. Past Medical History:  Diagnosis Date   Allergy    Cancer ([place])    basal cell   Past Surgical History:  Procedure Laterality Date   INGUINAL HERNIA REPAIR     MYRINGOTOMY     TONSILLECTOMY     Current Outpatient Medications on  File Prior to Visit  Medication Sig Dispense Refill   acetaminophen (TYLENOL) 500 MG tablet Take 500 mg by mouth every 6 (six) hours as needed.     ibuprofen (ADVIL,MOTRIN) 200 MG tablet Take 200 mg by mouth every 6 (six) hours as needed.     sodium chloride (OCEAN) 0.65 % SOLN nasal spray Place 1 spray into both nostrils as needed for congestion.     No current facility-administered medications on file prior to visit.   Allergies  Allergen Reactions   Benzonatate Hives   Dextromethorphan Hbr Hives   Influenza Vaccines Other (See Comments)    Pt reports feeling "horrible" after receiving vaccine   Social History   Socioeconomic History   Marital status: Married    Spouse name: Not on file   Number of children: Not on file   Years of education: Not on file   Highest education level: Not on file  Occupational History   Not on file  Tobacco Use   Smoking status: Never   Smokeless tobacco: Never  Vaping Use   Vaping Use: Never used  Substance and Sexual Activity   Alcohol use: Yes    Comment: occasional   Drug use: No   Sexual activity: Yes  Other Topics Concern   Not on file  Social History  Narrative   Not on file   Social Determinants of Health   Financial Resource Strain: Not on file  Food Insecurity: Not on file  Transportation Needs: Not on file  Physical Activity: Not on file  Stress: Not on file  Social Connections: Not on file  Intimate Partner Violence: Not on file   Family History  Problem Relation Age of Onset   Heart disease Mother        pericarditis   Thalassemia Mother    Cancer Father        bladder cancer, melanoma   Thalassemia Brother    Cancer Paternal Uncle        testicular   Dementia Maternal Grandfather    Cancer Paternal Grandfather        lyphoma   Diabetes Neg Hx    Early death Neg Hx    Hypertension Neg Hx    Hyperlipidemia Neg Hx    Stroke Neg Hx      Review of Systems  All other systems reviewed and are negative.      Objective:   Physical Exam Vitals reviewed.  Constitutional:      General: He is not in acute distress.    Appearance: Normal appearance. He is normal weight. He is not ill-appearing, toxic-appearing or diaphoretic.  HENT:     Head: Normocephalic and atraumatic.     Right Ear: Tympanic membrane and ear canal normal. There is no impacted cerumen.     Left Ear: Tympanic membrane and ear canal normal. There is no impacted cerumen.     Nose: Nose normal. No congestion or rhinorrhea.     Mouth/Throat:     Mouth: Mucous membranes are moist.     Pharynx: No oropharyngeal exudate or posterior oropharyngeal erythema.  Eyes:     General: No scleral icterus.       Right eye: No discharge.        Left eye: No discharge.     Extraocular Movements: Extraocular movements intact.     Conjunctiva/sclera: Conjunctivae normal.     Pupils: Pupils are equal, round, and reactive to light.  Neck:     Vascular: No carotid bruit.  Cardiovascular:     Rate and Rhythm: Normal rate and regular rhythm.     Pulses: Normal pulses.     Heart sounds: Normal heart sounds. No murmur heard.   No friction rub. No gallop.  Pulmonary:     Effort: Pulmonary effort is normal. No respiratory distress.     Breath sounds: Normal breath sounds. No stridor. No wheezing, rhonchi or rales.  Chest:     Chest wall: No tenderness.  Abdominal:     General: Abdomen is flat. Bowel sounds are normal. There is no distension.     Palpations: Abdomen is soft. There is no mass.     Tenderness: There is no abdominal tenderness. There is no guarding or rebound.     Hernia: No hernia is present. There is no hernia in the right inguinal area.  Genitourinary:    Pubic Area: No rash.      Penis: Normal and circumcised.      Testes: Cremasteric reflex is present.        Right: Mass not present.        Left: Mass not present.     Epididymis:     Right: Inflamed. Not enlarged. Tenderness present. No mass.  Musculoskeletal:      Cervical back: Normal range of motion and  neck supple. No rigidity or tenderness.     Right lower leg: No edema.     Left lower leg: No edema.  Lymphadenopathy:     Cervical: No cervical adenopathy.     Lower Body: No right inguinal adenopathy.  Skin:    General: Skin is warm.     Coloration: Skin is not jaundiced.     Findings: Lesion present. No bruising or rash.  Neurological:     General: No focal deficit present.     Mental Status: He is alert and oriented to person, place, and time.     Cranial Nerves: No cranial nerve deficit.     Motor: No weakness.     Gait: Gait normal.     Deep Tendon Reflexes: Reflexes normal.  Psychiatric:        Mood and Affect: Mood normal.        Behavior: Behavior normal.        Thought Content: Thought content normal.        Judgment: Judgment normal.    Patient has numerous moles all over his body.  There are numerous previous skin biopsy sites on his back.  There is one atypical mole roughly at the level of T4.  He sees his dermatologist every 6 months.      Assessment & Plan:   General medical exam - Plan: PSA, CBC with Differential/Platelet, COMPLETE METABOLIC PANEL WITH GFR, Lipid panel  Lower urinary tract symptoms (LUTS) - Plan: PSA  Right inguinal pain  Epididymitis, right I do not appreciate any hernias.  I do not appreciate any enlarged lymph nodes.  He is quite sensitive on his epididymis therefore I am concerned about epididymitis.  The other potential would be nerve irritation from trauma and lifting.  Therefore I recommended that we try doxycycline 100 mg p.o. twice daily for 7 days for possible epididymitis coupled with ibuprofen 800 mg every 8 hours for possible nerve irritation and functional testicular pain.  Check PSA to screen for prostate cancer and also check CBC CMP fasting lipid panel.  He is due for a tetanus shot in 1 year.  The remainder of his preventative care is up-to-date

## 2020-09-12 DIAGNOSIS — D225 Melanocytic nevi of trunk: Secondary | ICD-10-CM | POA: Diagnosis not present

## 2020-09-12 DIAGNOSIS — Z85828 Personal history of other malignant neoplasm of skin: Secondary | ICD-10-CM | POA: Diagnosis not present

## 2020-09-12 DIAGNOSIS — D2239 Melanocytic nevi of other parts of face: Secondary | ICD-10-CM | POA: Diagnosis not present

## 2020-09-12 DIAGNOSIS — D224 Melanocytic nevi of scalp and neck: Secondary | ICD-10-CM | POA: Diagnosis not present

## 2020-12-15 ENCOUNTER — Ambulatory Visit (INDEPENDENT_AMBULATORY_CARE_PROVIDER_SITE_OTHER): Payer: BC Managed Care – PPO | Admitting: Family Medicine

## 2020-12-15 ENCOUNTER — Other Ambulatory Visit: Payer: Self-pay

## 2020-12-15 ENCOUNTER — Encounter: Payer: Self-pay | Admitting: Family Medicine

## 2020-12-15 VITALS — BP 122/78 | HR 85 | Temp 97.9°F | Resp 16 | Ht 71.0 in | Wt 207.0 lb

## 2020-12-15 DIAGNOSIS — R1031 Right lower quadrant pain: Secondary | ICD-10-CM

## 2020-12-15 NOTE — Progress Notes (Signed)
Subjective:    Patient ID: Mark Francis, male    DOB: September 20, 1975, 45 y.o.   MRN: [id number]  HPI Patient reports intermittent right lower quadrant abdominal pain.  He states that the pain is sharp at times.  Is located primarily in the inguinal canal.  He denies any bulge in the inguinal canal.  He denies any hematuria or dysuria or penile discharge.  There is no palpable lymphadenopathy in the inguinal canal.  There is no testicular mass or testicular tenderness.  He denies any melena or hematochezia.  However he is tender to palpation in the right lower quadrant and does have some voluntary guarding.  He also has a faint palpable bulge on Valsalva in the right inguinal canal.  Movement exacerbates the pain although the pain can also occur randomly for no reason. Past Medical History:  Diagnosis Date  . Allergy   . Cancer ([place])    basal cell   Past Surgical History:  Procedure Laterality Date  . INGUINAL HERNIA REPAIR    . MYRINGOTOMY    . TONSILLECTOMY     Current Outpatient Medications on File Prior to Visit  Medication Sig Dispense Refill  . acetaminophen (TYLENOL) 500 MG tablet Take 500 mg by mouth every 6 (six) hours as needed.    Marland Kitchen ibuprofen (ADVIL,MOTRIN) 200 MG tablet Take 200 mg by mouth every 6 (six) hours as needed.    . sodium chloride (OCEAN) 0.65 % SOLN nasal spray Place 1 spray into both nostrils as needed for congestion.     No current facility-administered medications on file prior to visit.   Allergies  Allergen Reactions  . Benzonatate Hives  . Dextromethorphan Hbr Hives  . Influenza Vaccines Other (See Comments)    Pt reports feeling "horrible" after receiving vaccine   Social History   Socioeconomic History  . Marital status: Married    Spouse name: Not on file  . Number of children: Not on file  . Years of education: Not on file  . Highest education level: Not on file  Occupational History  . Not on file  Tobacco Use  . Smoking status: Never  .  Smokeless tobacco: Never  Vaping Use  . Vaping Use: Never used  Substance and Sexual Activity  . Alcohol use: Yes    Comment: occasional  . Drug use: No  . Sexual activity: Yes  Other Topics Concern  . Not on file  Social History Narrative  . Not on file   Social Determinants of Health   Financial Resource Strain: Not on file  Food Insecurity: Not on file  Transportation Needs: Not on file  Physical Activity: Not on file  Stress: Not on file  Social Connections: Not on file  Intimate Partner Violence: Not on file   Family History  Problem Relation Age of Onset  . Heart disease Mother        pericarditis  . Thalassemia Mother   . Cancer Father        bladder cancer, melanoma  . Thalassemia Brother   . Cancer Paternal Uncle        testicular  . Dementia Maternal Grandfather   . Cancer Paternal Grandfather        lyphoma  . Diabetes Neg Hx   . Early death Neg Hx   . Hypertension Neg Hx   . Hyperlipidemia Neg Hx   . Stroke Neg Hx      Review of Systems  All other systems reviewed and  are negative.     Objective:   Physical Exam Vitals reviewed.  Constitutional:      General: He is not in acute distress.    Appearance: Normal appearance. He is normal weight. He is not ill-appearing, toxic-appearing or diaphoretic.  HENT:     Head: Normocephalic and atraumatic.     Right Ear: Tympanic membrane and ear canal normal. There is no impacted cerumen.     Left Ear: Tympanic membrane and ear canal normal. There is no impacted cerumen.     Nose: Nose normal. No congestion or rhinorrhea.     Mouth/Throat:     Mouth: Mucous membranes are moist.     Pharynx: No oropharyngeal exudate or posterior oropharyngeal erythema.  Eyes:     General: No scleral icterus.       Right eye: No discharge.        Left eye: No discharge.     Extraocular Movements: Extraocular movements intact.     Conjunctiva/sclera: Conjunctivae normal.     Pupils: Pupils are equal, round, and  reactive to light.  Neck:     Vascular: No carotid bruit.  Cardiovascular:     Rate and Rhythm: Normal rate and regular rhythm.     Pulses: Normal pulses.     Heart sounds: Normal heart sounds. No murmur heard.   No friction rub. No gallop.  Pulmonary:     Effort: Pulmonary effort is normal. No respiratory distress.     Breath sounds: Normal breath sounds. No stridor. No wheezing, rhonchi or rales.  Chest:     Chest wall: No tenderness.  Abdominal:     General: Abdomen is flat. Bowel sounds are normal. There is no distension.     Palpations: Abdomen is soft. There is no mass.     Tenderness: There is abdominal tenderness in the right lower quadrant. There is guarding. There is no rebound.     Hernia: A hernia is present. Hernia is present in the right inguinal area.  Genitourinary:    Pubic Area: No rash.      Penis: Normal and circumcised.      Testes: Cremasteric reflex is present.        Right: Mass not present.        Left: Mass not present.     Epididymis:     Right: Inflamed. Not enlarged. Tenderness present. No mass.  Musculoskeletal:     Cervical back: Normal range of motion and neck supple. No rigidity or tenderness.     Right lower leg: No edema.     Left lower leg: No edema.  Lymphadenopathy:     Cervical: No cervical adenopathy.     Lower Body: No right inguinal adenopathy.  Skin:    General: Skin is warm.     Coloration: Skin is not jaundiced.     Findings: Lesion present. No bruising or rash.  Neurological:     General: No focal deficit present.     Mental Status: He is alert and oriented to person, place, and time.     Cranial Nerves: No cranial nerve deficit.     Motor: No weakness.     Gait: Gait normal.     Deep Tendon Reflexes: Reflexes normal.  Psychiatric:        Mood and Affect: Mood normal.        Behavior: Behavior normal.        Thought Content: Thought content normal.  Judgment: Judgment normal.    Patient has numerous moles all over  his body.  There are numerous previous skin biopsy sites on his back.  There is one atypical mole roughly at the level of T4.  He sees his dermatologist every 6 months.      Assessment & Plan:   Colicky RLQ abdominal pain History is uncertain.  I feel that the patient most likely has either a sports hernia or a inguinal hernia as the cause of his pain.  He also had previous hernia surgery in that area when he was a child at 70 years old and so is possible that there could be some scar tissue around the inguinal ring causing pain.  I feel musculoskeletal is most likely because.  However his exam today shows tenderness to palpation and voluntary guarding in the right lower quadrant so I will obtain a CT scan of the abdomen and pelvis to evaluate further as this would be atypical for any type of hernia.

## 2021-01-04 ENCOUNTER — Ambulatory Visit
Admission: RE | Admit: 2021-01-04 | Discharge: 2021-01-04 | Disposition: A | Discharge status: Home / Self Care | Payer: BC Managed Care – PPO | Source: Ambulatory Visit | Attending: Family Medicine | Admitting: Family Medicine

## 2021-01-04 ENCOUNTER — Other Ambulatory Visit: Payer: Self-pay

## 2021-01-04 DIAGNOSIS — R1031 Right lower quadrant pain: Secondary | ICD-10-CM | POA: Diagnosis not present

## 2021-01-04 MED ORDER — IOPAMIDOL (ISOVUE-300) INJECTION 61%
100.0000 mL | Freq: Once | INTRAVENOUS | Status: AC | PRN
  Administered 2021-01-04: 100 mL via INTRAVENOUS

## 2021-03-15 DIAGNOSIS — Z85828 Personal history of other malignant neoplasm of skin: Secondary | ICD-10-CM | POA: Diagnosis not present

## 2021-03-15 DIAGNOSIS — D225 Melanocytic nevi of trunk: Secondary | ICD-10-CM | POA: Diagnosis not present

## 2021-03-15 DIAGNOSIS — D2262 Melanocytic nevi of left upper limb, including shoulder: Secondary | ICD-10-CM | POA: Diagnosis not present

## 2021-03-15 DIAGNOSIS — D2261 Melanocytic nevi of right upper limb, including shoulder: Secondary | ICD-10-CM | POA: Diagnosis not present

## 2021-05-19 IMAGING — CT CT ABD-PELV W/ CM
2 of 5 series · 13 of 46 positions shown, 15 images · IV contrast (iopamidol)
Comparison: None

CLINICAL DATA: Enlarged pelvic and inguinal lymph nodes, RIGHT
inguinal and groin pain

EXAM:
CT ABDOMEN AND PELVIS WITH CONTRAST
TECHNIQUE: Multidetector CT imaging of the abdomen and pelvis was performed
using the standard protocol following bolus administration of
intravenous contrast. Sagittal and coronal MPR images reconstructed
from axial data set.
CONTRAST:  125mL UO4TXM-5LL IOPAMIDOL (UO4TXM-5LL) INJECTION 61% IV.
Dilute oral contrast.

[Series 4: abd pelvis 5.00 br60 s3 axial · axial · 0.57mm/px · z∈[+1161,+1546]mm · 10 of 93 slices shown, 12 images]
[im 8/93  soft-tissue]
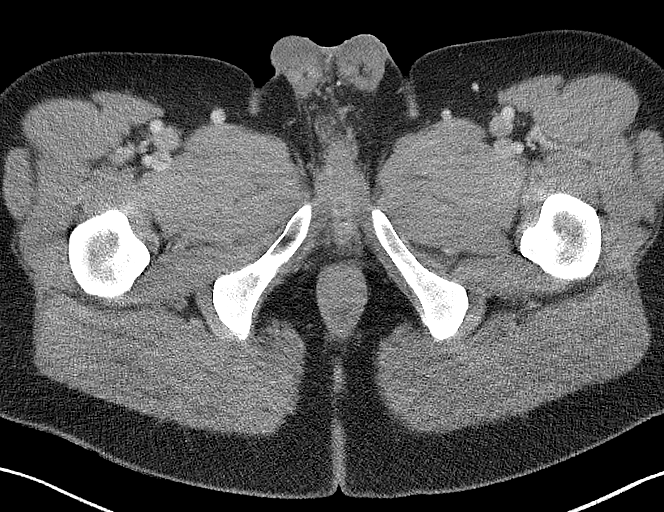
[im 8/93  bone]
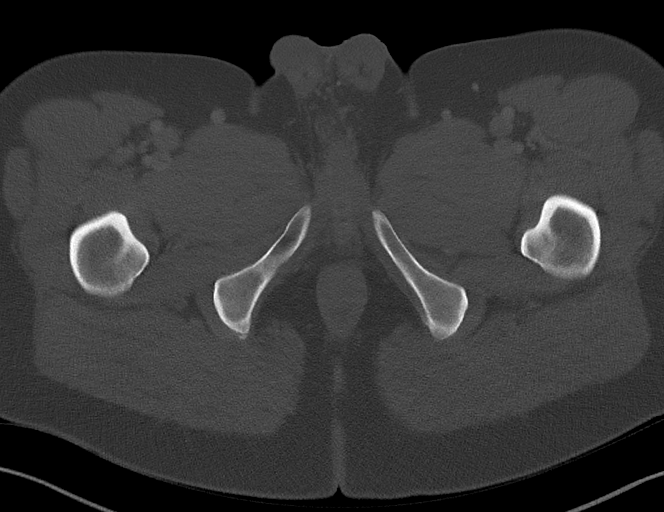
[im 15/93  soft-tissue]
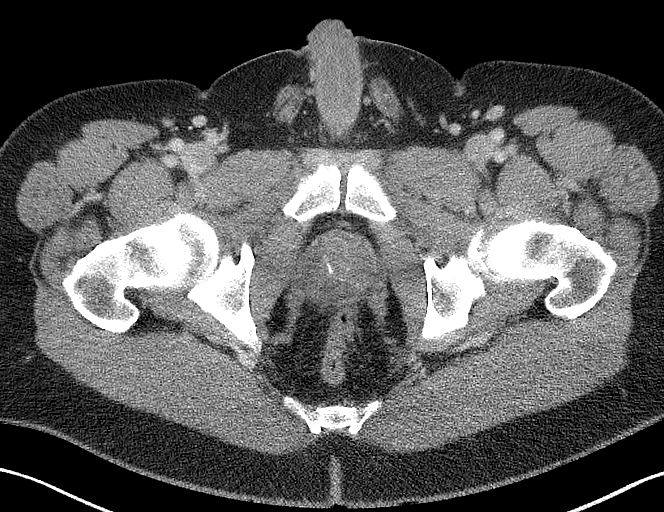
[im 29/93  soft-tissue]
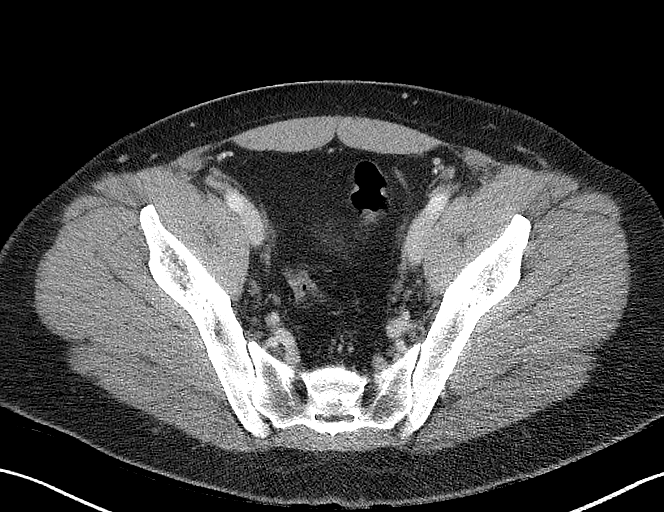
[im 36/93  soft-tissue]
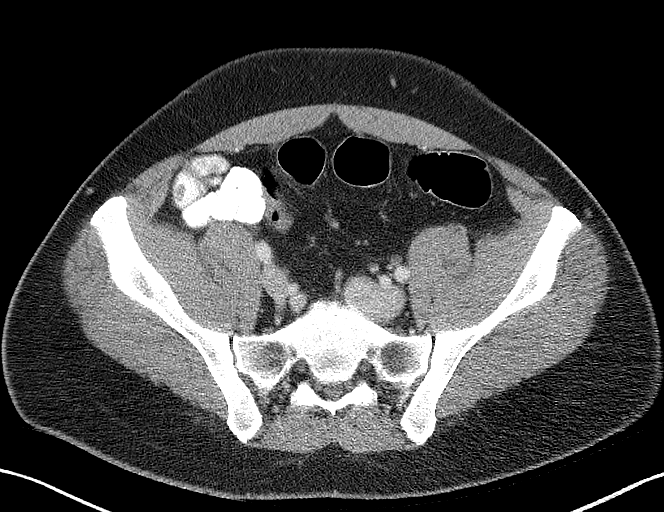
[im 43/93  soft-tissue]
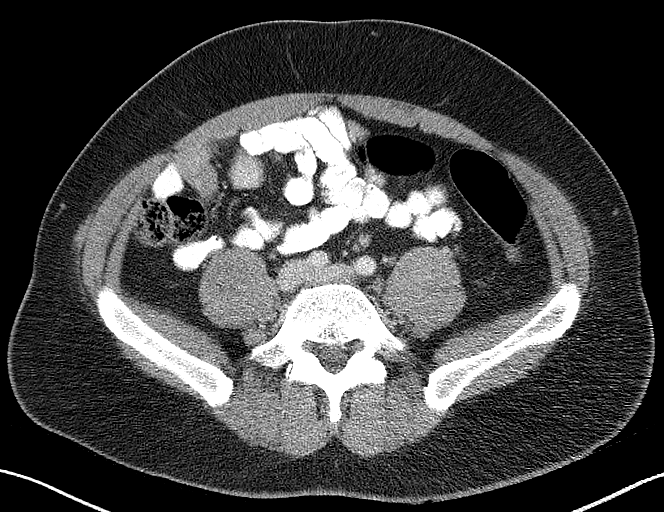
[im 50/93  soft-tissue]
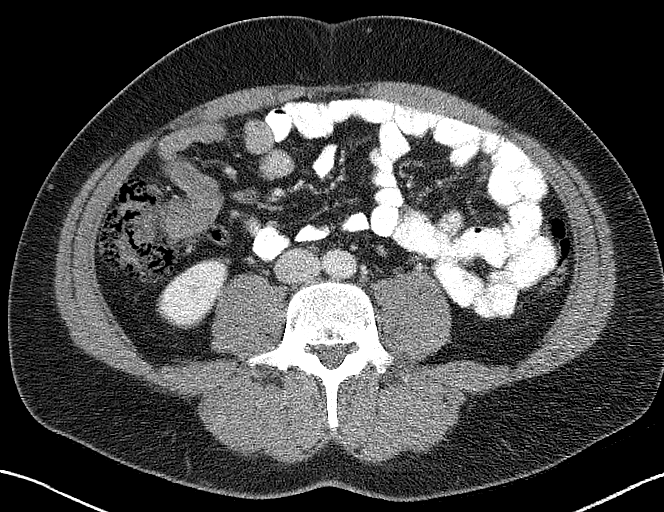
[im 57/93  soft-tissue]
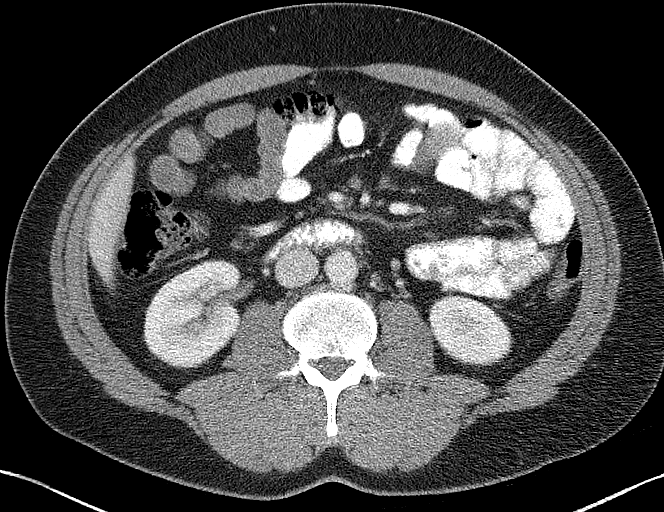
[im 71/93  soft-tissue]
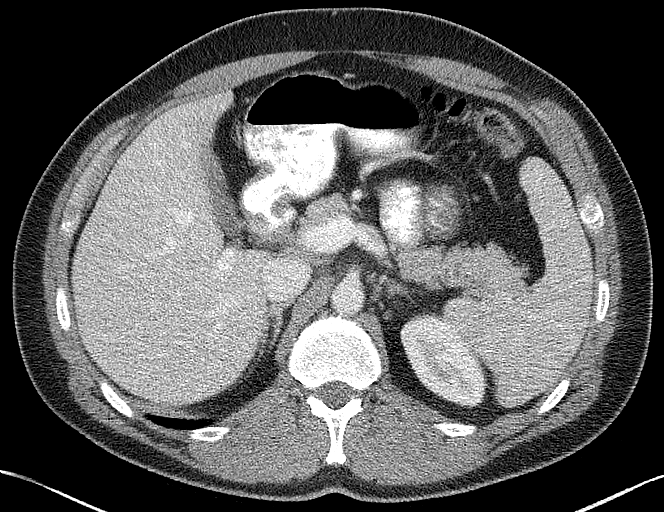
[im 78/93  soft-tissue]
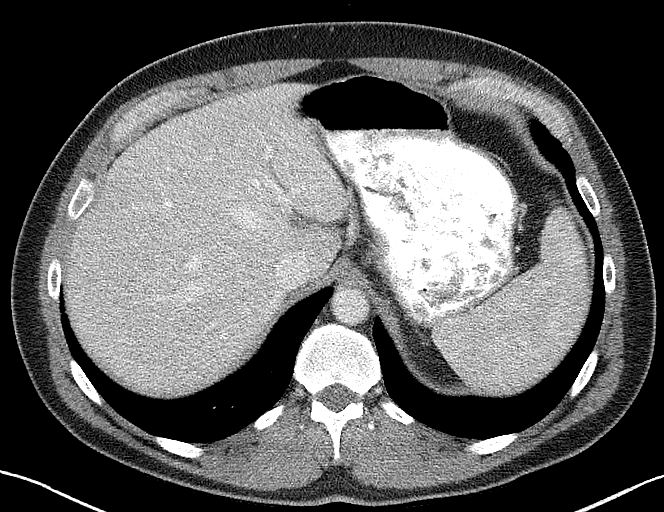
[im 78/93  bone]
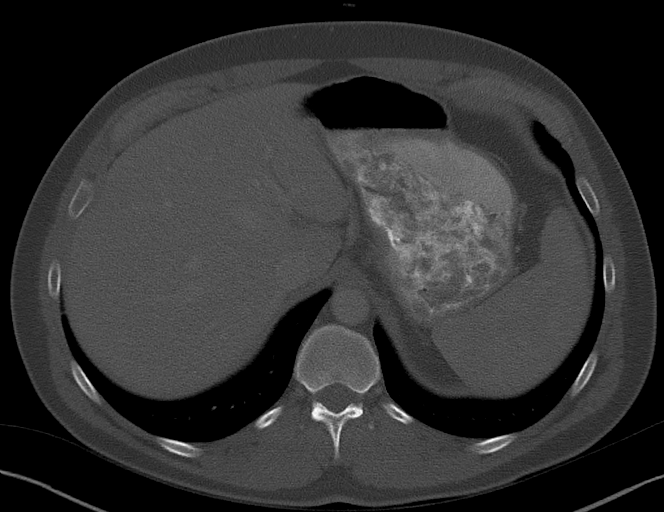
[im 85/93  soft-tissue]
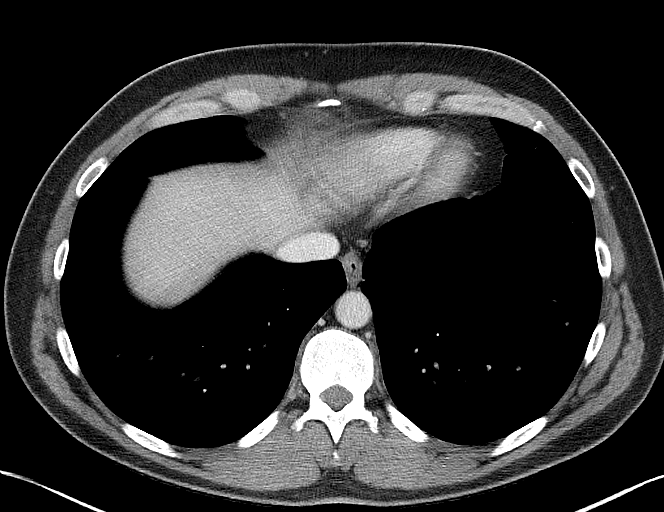

[Series 6: abd pelvis 2.00 br40 s3 cor · coronal · 0.74mm/px · 3 of 146 slices shown]
[im 49/146  soft-tissue]
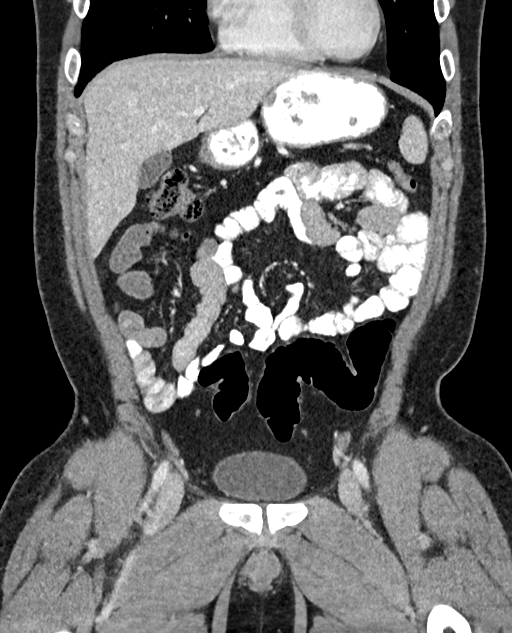
[im 65/146  soft-tissue]
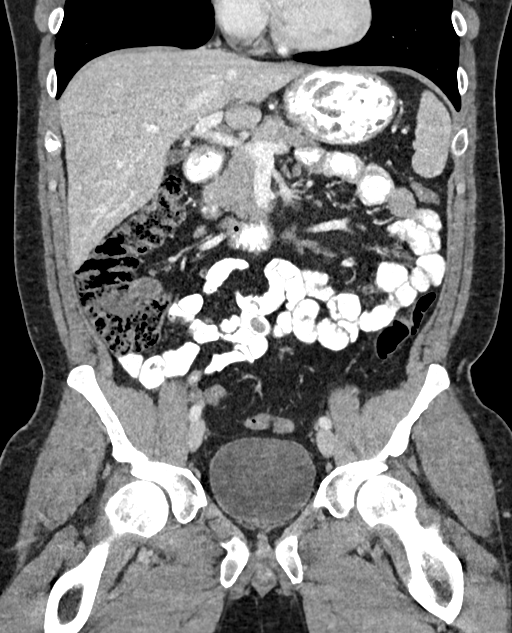
[im 81/146  soft-tissue]
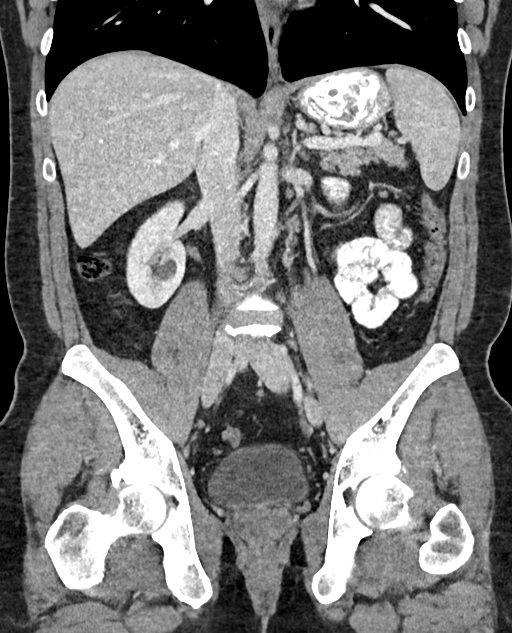

[13 of 46 positions shown; findings below may reference images not displayed]

FINDINGS: Lower chest: Lung bases clear

Hepatobiliary: Gallbladder and liver normal appearance

Pancreas: Normal appearance

Spleen: Normal appearance

Adrenals/Urinary Tract: Adrenal glands, kidneys, ureters, and
bladder normal appearance

Stomach/Bowel: Normal appendix. Stomach and bowel loops normal
appearance

Vascular/Lymphatic: Vascular structures patent. Aorta normal
caliber. Scattered normal sized mesenteric and retroperitoneal lymph
nodes. Few normal sized inguinal lymph nodes bilaterally. No
enlarged abdominal or pelvic lymph nodes identified.

Reproductive: Unremarkable prostate gland and seminal vesicles

Other: No free air, free fluid, hernia or acute inflammatory
process.

Musculoskeletal: Unremarkable
IMPRESSION: Normal exam.

Specifically, no abdominal or pelvic adenopathy identified.

## 2021-09-14 DIAGNOSIS — D485 Neoplasm of uncertain behavior of skin: Secondary | ICD-10-CM | POA: Diagnosis not present

## 2021-09-14 DIAGNOSIS — Z85828 Personal history of other malignant neoplasm of skin: Secondary | ICD-10-CM | POA: Diagnosis not present

## 2021-09-14 DIAGNOSIS — D2272 Melanocytic nevi of left lower limb, including hip: Secondary | ICD-10-CM | POA: Diagnosis not present

## 2021-09-14 DIAGNOSIS — D225 Melanocytic nevi of trunk: Secondary | ICD-10-CM | POA: Diagnosis not present

## 2021-10-27 ENCOUNTER — Ambulatory Visit (INDEPENDENT_AMBULATORY_CARE_PROVIDER_SITE_OTHER): Payer: BC Managed Care – PPO | Admitting: Family Medicine

## 2021-10-27 ENCOUNTER — Ambulatory Visit
Admission: RE | Admit: 2021-10-27 | Discharge: 2021-10-27 | Disposition: A | Discharge status: Home / Self Care | Payer: BC Managed Care – PPO | Source: Ambulatory Visit | Attending: Family Medicine | Admitting: Family Medicine

## 2021-10-27 VITALS — BP 124/72 | HR 97 | Temp 99.0°F | Ht 70.0 in | Wt 204.0 lb

## 2021-10-27 DIAGNOSIS — R051 Acute cough: Secondary | ICD-10-CM

## 2021-10-27 DIAGNOSIS — R059 Cough, unspecified: Secondary | ICD-10-CM | POA: Diagnosis not present

## 2021-10-27 MED ORDER — AMOXICILLIN-POT CLAVULANATE 875-125 MG PO TABS: 1.0000 | ORAL_TABLET | Freq: Two times a day (BID) | ORAL | 0 refills | Status: DC

## 2021-10-27 NOTE — Progress Notes (Signed)
Subjective:    Patient ID: Mark Francis, male    DOB: Apr 02, 1975, 46 y.o.   MRN: [id number]  HPI Patient presents with symptoms that began on Tuesday.  He has a low-grade fever to 99.  He reports generally feeling weak and achy.  He is having left-sided chest wall pain.  He is tender to palpation over the left pectoralis muscle and in the left axilla and also posteriorly along the left ribs.  He is coughing occasionally.  He denies any purulent sputum.  He denies any hemoptysis.  He does have some nausea.  He denies any vomiting.  He does have some diarrhea. Past Medical History:  Diagnosis Date   Allergy    Cancer ([place])    basal cell   Past Surgical History:  Procedure Laterality Date   INGUINAL HERNIA REPAIR     MYRINGOTOMY     TONSILLECTOMY     Current Outpatient Medications on File Prior to Visit  Medication Sig Dispense Refill   acetaminophen (TYLENOL) 500 MG tablet Take 500 mg by mouth every 6 (six) hours as needed.     ibuprofen (ADVIL,MOTRIN) 200 MG tablet Take 200 mg by mouth every 6 (six) hours as needed.     sodium chloride (OCEAN) 0.65 % SOLN nasal spray Place 1 spray into both nostrils as needed for congestion.     No current facility-administered medications on file prior to visit.   Allergies  Allergen Reactions   Benzonatate Hives   Dextromethorphan Hbr Hives   Influenza Vaccines Other (See Comments)    Pt reports feeling "horrible" after receiving vaccine   Social History   Socioeconomic History   Marital status: Married    Spouse name: Not on file   Number of children: Not on file   Years of education: Not on file   Highest education level: Not on file  Occupational History   Not on file  Tobacco Use   Smoking status: Never   Smokeless tobacco: Never  Vaping Use   Vaping Use: Never used  Substance and Sexual Activity   Alcohol use: Yes    Comment: occasional   Drug use: No   Sexual activity: Yes  Other Topics Concern   Not on file  Social  History Narrative   Not on file   Social Determinants of Health   Financial Resource Strain: Not on file  Food Insecurity: Not on file  Transportation Needs: Not on file  Physical Activity: Not on file  Stress: Not on file  Social Connections: Not on file  Intimate Partner Violence: Not on file   Family History  Problem Relation Age of Onset   Heart disease Mother        pericarditis   Thalassemia Mother    Cancer Father        bladder cancer, melanoma   Thalassemia Brother    Cancer Paternal Uncle        testicular   Dementia Maternal Grandfather    Cancer Paternal Grandfather        lyphoma   Diabetes Neg Hx    Early death Neg Hx    Hypertension Neg Hx    Hyperlipidemia Neg Hx    Stroke Neg Hx      Review of Systems  All other systems reviewed and are negative.      Objective:   Physical Exam Vitals reviewed.  Constitutional:      General: He is not in acute distress.  Appearance: Normal appearance. He is normal weight. He is not ill-appearing, toxic-appearing or diaphoretic.  HENT:     Head: Normocephalic and atraumatic.     Right Ear: Tympanic membrane and ear canal normal. There is no impacted cerumen.     Left Ear: Tympanic membrane and ear canal normal. There is no impacted cerumen.     Nose: Nose normal. No congestion or rhinorrhea.     Mouth/Throat:     Mouth: Mucous membranes are moist.     Pharynx: No oropharyngeal exudate or posterior oropharyngeal erythema.  Eyes:     General: No scleral icterus.       Right eye: No discharge.        Left eye: No discharge.     Extraocular Movements: Extraocular movements intact.     Conjunctiva/sclera: Conjunctivae normal.     Pupils: Pupils are equal, round, and reactive to light.  Neck:     Vascular: No carotid bruit.  Cardiovascular:     Rate and Rhythm: Normal rate and regular rhythm.     Pulses: Normal pulses.     Heart sounds: Normal heart sounds. No murmur heard.    No friction rub. No gallop.   Pulmonary:     Effort: Pulmonary effort is normal. No respiratory distress.     Breath sounds: Normal breath sounds. No stridor. No wheezing, rhonchi or rales.  Chest:     Chest wall: No tenderness.  Abdominal:     General: Abdomen is flat. Bowel sounds are normal. There is no distension.     Palpations: Abdomen is soft. There is no mass.     Tenderness: There is abdominal tenderness in the right lower quadrant. There is guarding. There is no rebound.     Hernia: A hernia is present. Hernia is present in the right inguinal area.  Musculoskeletal:     Cervical back: Normal range of motion and neck supple. No rigidity or tenderness.     Right lower leg: No edema.     Left lower leg: No edema.  Lymphadenopathy:     Cervical: No cervical adenopathy.     Lower Body: No right inguinal adenopathy.  Skin:    General: Skin is warm.     Coloration: Skin is not jaundiced.     Findings: Lesion present. No bruising or rash.  Neurological:     General: No focal deficit present.     Mental Status: He is alert and oriented to person, place, and time.     Cranial Nerves: No cranial nerve deficit.     Motor: No weakness.     Gait: Gait normal.     Deep Tendon Reflexes: Reflexes normal.  Psychiatric:        Mood and Affect: Mood normal.        Behavior: Behavior normal.        Thought Content: Thought content normal.        Judgment: Judgment normal.        Assessment & Plan:   Acute cough - Plan: DG Chest 2 View I suspect that the patient has a viral syndrome.  Possibilities include COVID versus another viral illness.  He denies any tick bites or rashes.  I suspect a viral upper respiratory infection.  I recommended rest, pushing fluids, and ibuprofen for pain.  The other possibility would be an early pneumonia.  His pulmonary exam is normal but he is having significant left-sided chest wall pain.  Therefore I will obtain an  x-ray to rule out pneumonia.  If his cough worsens or if he develops  a high fever I will treat community-acquired pneumonia with Augmentin

## 2022-03-15 DIAGNOSIS — D224 Melanocytic nevi of scalp and neck: Secondary | ICD-10-CM | POA: Diagnosis not present

## 2022-03-15 DIAGNOSIS — Z85828 Personal history of other malignant neoplasm of skin: Secondary | ICD-10-CM | POA: Diagnosis not present

## 2022-03-15 DIAGNOSIS — D2261 Melanocytic nevi of right upper limb, including shoulder: Secondary | ICD-10-CM | POA: Diagnosis not present

## 2022-03-15 DIAGNOSIS — D225 Melanocytic nevi of trunk: Secondary | ICD-10-CM | POA: Diagnosis not present

## 2022-09-18 DIAGNOSIS — D485 Neoplasm of uncertain behavior of skin: Secondary | ICD-10-CM | POA: Diagnosis not present

## 2022-09-18 DIAGNOSIS — Z85828 Personal history of other malignant neoplasm of skin: Secondary | ICD-10-CM | POA: Diagnosis not present

## 2022-09-18 DIAGNOSIS — D2262 Melanocytic nevi of left upper limb, including shoulder: Secondary | ICD-10-CM | POA: Diagnosis not present

## 2022-09-18 DIAGNOSIS — D2261 Melanocytic nevi of right upper limb, including shoulder: Secondary | ICD-10-CM | POA: Diagnosis not present

## 2022-09-18 DIAGNOSIS — D225 Melanocytic nevi of trunk: Secondary | ICD-10-CM | POA: Diagnosis not present

## 2022-10-03 DIAGNOSIS — L988 Other specified disorders of the skin and subcutaneous tissue: Secondary | ICD-10-CM | POA: Diagnosis not present

## 2022-10-03 DIAGNOSIS — D485 Neoplasm of uncertain behavior of skin: Secondary | ICD-10-CM | POA: Diagnosis not present

## 2023-03-13 DIAGNOSIS — Z85828 Personal history of other malignant neoplasm of skin: Secondary | ICD-10-CM | POA: Diagnosis not present

## 2023-03-13 DIAGNOSIS — D485 Neoplasm of uncertain behavior of skin: Secondary | ICD-10-CM | POA: Diagnosis not present

## 2023-03-13 DIAGNOSIS — D2261 Melanocytic nevi of right upper limb, including shoulder: Secondary | ICD-10-CM | POA: Diagnosis not present

## 2023-03-13 DIAGNOSIS — D225 Melanocytic nevi of trunk: Secondary | ICD-10-CM | POA: Diagnosis not present

## 2023-06-04 ENCOUNTER — Encounter: Payer: Self-pay | Admitting: Family Medicine

## 2023-06-04 ENCOUNTER — Ambulatory Visit (INDEPENDENT_AMBULATORY_CARE_PROVIDER_SITE_OTHER): Admitting: Family Medicine

## 2023-06-04 VITALS — BP 122/82 | HR 82 | Temp 97.6°F | Ht 70.0 in | Wt 210.8 lb

## 2023-06-04 DIAGNOSIS — Z13220 Encounter for screening for lipoid disorders: Secondary | ICD-10-CM | POA: Diagnosis not present

## 2023-06-04 DIAGNOSIS — Z1211 Encounter for screening for malignant neoplasm of colon: Secondary | ICD-10-CM

## 2023-06-04 DIAGNOSIS — Z0000 Encounter for general adult medical examination without abnormal findings: Secondary | ICD-10-CM | POA: Diagnosis not present

## 2023-06-04 DIAGNOSIS — R399 Unspecified symptoms and signs involving the genitourinary system: Secondary | ICD-10-CM | POA: Diagnosis not present

## 2023-06-04 NOTE — Progress Notes (Signed)
 Subjective:    Patient ID: Mark Francis, male    DOB: [birth year], 48 y.o.   MRN: [id number]  HPI  Patient is a very pleasant 48 year old Caucasian male here today for complete physical exam.  He is due for colon cancer screening.  We discussed colonoscopy versus Cologuard and he elects to do Cologuard.  He does have symptoms of LUTS.  He would like to screen for prostate cancer with a PSA.  He is due for a tetanus shot.  He denies any shortness of breath or chest pain.  He does have a history of borderline elevated cholesterol in the past. Past Medical History:  Diagnosis Date   Allergy    Cancer (HCC)    basal cell   Past Surgical History:  Procedure Laterality Date   INGUINAL HERNIA REPAIR     MYRINGOTOMY     TONSILLECTOMY     Current Outpatient Medications on File Prior to Visit  Medication Sig Dispense Refill   ibuprofen (ADVIL,MOTRIN) 200 MG tablet Take 200 mg by mouth every 6 (six) hours as needed.     acetaminophen (TYLENOL) 500 MG tablet Take 500 mg by mouth every 6 (six) hours as needed. (Patient not taking: Reported on 06/04/2023)     sodium chloride (OCEAN) 0.65 % SOLN nasal spray Place 1 spray into both nostrils as needed for congestion. (Patient not taking: Reported on 06/04/2023)     No current facility-administered medications on file prior to visit.   Allergies  Allergen Reactions   Benzonatate Hives   Dextromethorphan Hbr Hives   Influenza Vaccines Other (See Comments)    Pt reports feeling "horrible" after receiving vaccine   Social History   Socioeconomic History   Marital status: Married    Spouse name: Not on file   Number of children: Not on file   Years of education: Not on file   Highest education level: Not on file  Occupational History   Not on file  Tobacco Use   Smoking status: Never   Smokeless tobacco: Never  Vaping Use   Vaping status: Never Used  Substance and Sexual Activity   Alcohol use: Yes    Comment: occasional   Drug use: No    Sexual activity: Yes  Other Topics Concern   Not on file  Social History Narrative   Not on file   Social Drivers of Health   Financial Resource Strain: Not on file  Food Insecurity: Not on file  Transportation Needs: Not on file  Physical Activity: Not on file  Stress: Not on file  Social Connections: Not on file  Intimate Partner Violence: Not on file   Family History  Problem Relation Age of Onset   Heart disease Mother        pericarditis   Thalassemia Mother    Cancer Father        bladder cancer, melanoma   Thalassemia Brother    Cancer Paternal Uncle        testicular   Dementia Maternal Grandfather    Cancer Paternal Grandfather        lyphoma   Diabetes Neg Hx    Early death Neg Hx    Hypertension Neg Hx    Hyperlipidemia Neg Hx    Stroke Neg Hx      Review of Systems  All other systems reviewed and are negative.      Objective:   Physical Exam Vitals reviewed.  Constitutional:      General:  He is not in acute distress.    Appearance: Normal appearance. He is normal weight. He is not ill-appearing, toxic-appearing or diaphoretic.  HENT:     Head: Normocephalic and atraumatic.     Right Ear: Tympanic membrane and ear canal normal. There is no impacted cerumen.     Left Ear: Tympanic membrane and ear canal normal. There is no impacted cerumen.     Nose: Nose normal. No congestion or rhinorrhea.     Mouth/Throat:     Mouth: Mucous membranes are moist.     Pharynx: No oropharyngeal exudate or posterior oropharyngeal erythema.  Eyes:     General: No scleral icterus.       Right eye: No discharge.        Left eye: No discharge.     Extraocular Movements: Extraocular movements intact.     Conjunctiva/sclera: Conjunctivae normal.     Pupils: Pupils are equal, round, and reactive to light.  Neck:     Vascular: No carotid bruit.  Cardiovascular:     Rate and Rhythm: Normal rate and regular rhythm.     Pulses: Normal pulses.     Heart sounds:  Normal heart sounds. No murmur heard.    No friction rub. No gallop.  Pulmonary:     Effort: Pulmonary effort is normal. No respiratory distress.     Breath sounds: Normal breath sounds. No stridor. No wheezing, rhonchi or rales.  Chest:     Chest wall: No tenderness.  Abdominal:     General: Abdomen is flat. Bowel sounds are normal. There is no distension.     Palpations: Abdomen is soft. There is no mass.     Tenderness: There is no abdominal tenderness. There is no guarding or rebound.     Hernia: No hernia is present.  Musculoskeletal:     Cervical back: Normal range of motion and neck supple. No rigidity or tenderness.     Right lower leg: No edema.     Left lower leg: No edema.  Lymphadenopathy:     Cervical: No cervical adenopathy.  Skin:    General: Skin is warm.     Coloration: Skin is not jaundiced.     Findings: Lesion present. No bruising or rash.  Neurological:     General: No focal deficit present.     Mental Status: He is alert and oriented to person, place, and time.     Cranial Nerves: No cranial nerve deficit.     Motor: No weakness.     Gait: Gait normal.     Deep Tendon Reflexes: Reflexes normal.  Psychiatric:        Mood and Affect: Mood normal.        Behavior: Behavior normal.        Thought Content: Thought content normal.        Judgment: Judgment normal.     Patient has numerous moles all over his body.  There are numerous previous skin biopsy sites on his back.  There is one atypical mole roughly at the level of T4.  He also has an atypical mole on his upper abdomen just below the xiphoid process.  I recommended that he see his dermatologist regarding these.  He sees his dermatologist every 6 months.      Assessment & Plan:   Colon cancer screening - Plan: Cologuard  Lower urinary tract symptoms (LUTS) - Plan: PSA  Screening cholesterol level - Plan: CBC with Differential/Platelet, COMPLETE METABOLIC PANEL WITHOUT  GFR, Lipid panel, CT CARDIAC  SCORING (SELF PAY ONLY)  General medical exam I will defer biopsies regarding the atypical moles to his dermatologist.  Blood pressure today is excellent.  He received his tetanus shot.  Will screen for colon cancer with Cologuard.  Will screen for prostate cancer given his lower urinary tract symptoms with a PSA.  Check CBC CMP and lipid panel.  Given his history of marginal elevated cholesterol in the past he would also like to get a coronary artery calcium score to help risk stratify himself in the future.

## 2023-06-05 LAB — COMPLETE METABOLIC PANEL WITHOUT GFR
AG Ratio: 2.2 (calc) (ref 1.0–2.5)
ALT: 17 U/L (ref 9–46)
AST: 16 U/L (ref 10–40)
Albumin: 4.7 g/dL (ref 3.6–5.1)
Alkaline phosphatase (APISO): 65 U/L (ref 36–130)
BUN: 13 mg/dL (ref 7–25)
CO2: 28 mmol/L (ref 20–32)
Calcium: 9 mg/dL (ref 8.6–10.3)
Chloride: 102 mmol/L (ref 98–110)
Creat: 0.91 mg/dL (ref 0.60–1.29)
Globulin: 2.1 g/dL (ref 1.9–3.7)
Glucose, Bld: 93 mg/dL (ref 65–99)
Potassium: 4.3 mmol/L (ref 3.5–5.3)
Sodium: 139 mmol/L (ref 135–146)
Total Bilirubin: 0.5 mg/dL (ref 0.2–1.2)
Total Protein: 6.8 g/dL (ref 6.1–8.1)

## 2023-06-05 LAB — CBC WITH DIFFERENTIAL/PLATELET
Absolute Lymphocytes: 2153 {cells}/uL (ref 850–3900)
Absolute Monocytes: 570 {cells}/uL (ref 200–950)
Basophils Absolute: 74 {cells}/uL (ref 0–200)
Basophils Relative: 1 %
Eosinophils Absolute: 192 {cells}/uL (ref 15–500)
Eosinophils Relative: 2.6 %
HCT: 43.7 % (ref 38.5–50.0)
Hemoglobin: 14.5 g/dL (ref 13.2–17.1)
MCH: 28.3 pg (ref 27.0–33.0)
MCHC: 33.2 g/dL (ref 32.0–36.0)
MCV: 85.4 fL (ref 80.0–100.0)
MPV: 12.2 fL (ref 7.5–12.5)
Monocytes Relative: 7.7 %
Neutro Abs: 4410 {cells}/uL (ref 1500–7800)
Neutrophils Relative %: 59.6 %
Platelets: 223 10*3/uL (ref 140–400)
RBC: 5.12 10*6/uL (ref 4.20–5.80)
RDW: 12.6 % (ref 11.0–15.0)
Total Lymphocyte: 29.1 %
WBC: 7.4 10*3/uL (ref 3.8–10.8)

## 2023-06-05 LAB — LIPID PANEL
Cholesterol: 188 mg/dL (ref <200)
HDL: 56 mg/dL (ref >=40)
LDL Cholesterol (Calc): 107 mg/dL — ABNORMAL HIGH
Non-HDL Cholesterol (Calc): 132 mg/dL — ABNORMAL HIGH (ref <130)
Total CHOL/HDL Ratio: 3.4 (calc) (ref <5.0)
Triglycerides: 136 mg/dL (ref <150)

## 2023-06-05 LAB — PSA: PSA: 0.73 ng/mL (ref <=4.00)

## 2023-06-12 LAB — COLOGUARD: COLOGUARD: NEGATIVE

## 2023-06-13 ENCOUNTER — Ambulatory Visit (HOSPITAL_COMMUNITY)
Admission: RE | Admit: 2023-06-13 | Discharge: 2023-06-13 | Disposition: A | Discharge status: Home / Self Care | Payer: Self-pay | Source: Ambulatory Visit | Attending: Family Medicine | Admitting: Family Medicine

## 2023-06-13 ENCOUNTER — Ambulatory Visit (INDEPENDENT_AMBULATORY_CARE_PROVIDER_SITE_OTHER)

## 2023-06-13 DIAGNOSIS — Z23 Encounter for immunization: Secondary | ICD-10-CM | POA: Diagnosis not present

## 2023-06-13 DIAGNOSIS — Z13220 Encounter for screening for lipoid disorders: Secondary | ICD-10-CM | POA: Insufficient documentation

## 2023-06-13 NOTE — Progress Notes (Signed)
 Patient is in office today for a nurse visit for Immunization. Patient Injection was given in the  Left deltoid. Patient tolerated injection well.

## 2023-06-17 ENCOUNTER — Ambulatory Visit

## 2023-08-15 DIAGNOSIS — Z85828 Personal history of other malignant neoplasm of skin: Secondary | ICD-10-CM | POA: Diagnosis not present

## 2023-08-15 DIAGNOSIS — D485 Neoplasm of uncertain behavior of skin: Secondary | ICD-10-CM | POA: Diagnosis not present

## 2023-08-15 DIAGNOSIS — D2261 Melanocytic nevi of right upper limb, including shoulder: Secondary | ICD-10-CM | POA: Diagnosis not present

## 2023-08-15 DIAGNOSIS — D225 Melanocytic nevi of trunk: Secondary | ICD-10-CM | POA: Diagnosis not present

## 2023-08-15 DIAGNOSIS — D2262 Melanocytic nevi of left upper limb, including shoulder: Secondary | ICD-10-CM | POA: Diagnosis not present

## 2023-10-16 DIAGNOSIS — D485 Neoplasm of uncertain behavior of skin: Secondary | ICD-10-CM | POA: Diagnosis not present

## 2023-10-16 DIAGNOSIS — L988 Other specified disorders of the skin and subcutaneous tissue: Secondary | ICD-10-CM | POA: Diagnosis not present

## 2023-10-29 ENCOUNTER — Encounter: Payer: Self-pay | Admitting: Family Medicine

## 2023-10-29 ENCOUNTER — Ambulatory Visit (INDEPENDENT_AMBULATORY_CARE_PROVIDER_SITE_OTHER): Admitting: Family Medicine

## 2023-10-29 ENCOUNTER — Ambulatory Visit: Payer: Self-pay

## 2023-10-29 VITALS — BP 136/82 | HR 69 | Temp 98.2°F | Ht 70.0 in | Wt 211.4 lb

## 2023-10-29 DIAGNOSIS — R300 Dysuria: Secondary | ICD-10-CM | POA: Diagnosis not present

## 2023-10-29 LAB — URINALYSIS, ROUTINE W REFLEX MICROSCOPIC
Bilirubin Urine: NEGATIVE
Glucose, UA: NEGATIVE
Hgb urine dipstick: NEGATIVE
Ketones, ur: NEGATIVE
Leukocytes,Ua: NEGATIVE
Nitrite: NEGATIVE
Protein, ur: NEGATIVE
Specific Gravity, Urine: 1.01 (ref 1.001–1.035)
pH: 6.5 (ref 5.0–8.0)

## 2023-10-29 MED ORDER — TAMSULOSIN HCL 0.4 MG PO CAPS: 0.4000 mg | ORAL_CAPSULE | Freq: Every day | ORAL | 0 refills | Status: DC

## 2023-10-29 MED ORDER — CIPROFLOXACIN HCL 500 MG PO TABS: 500.0000 mg | ORAL_TABLET | Freq: Two times a day (BID) | ORAL | 0 refills | Status: AC

## 2023-10-29 NOTE — Telephone Encounter (Signed)
 FYI Only or Action Required?: FYI only for provider.  Patient was last seen in primary care on 06/04/2023 by Mark Francis DASEN, MD.  Called Nurse Triage reporting painful urination.  Symptoms began a week ago.  Interventions attempted: Nothing.  Symptoms are: unchanged.  Triage Disposition: See Physician Within 24 Hours  Patient/caregiver understands and will follow disposition?: Yes    Copied from CRM #[id number]. Topic: Clinical - Red Word Triage >> Oct 29, 2023  8:39 AM Marissa P wrote: Red Word that prompted transfer to Nurse Triage: Patient is having pain in his urological tract, doctor is aware he stated and needs to see doctor soon as possible. It is not getting worse but it is just not getting any better. Reason for Disposition  All other males with painful urination  Answer Assessment - Initial Assessment Questions 1. SEVERITY: How bad is the pain?  (e.g., Scale 1-10; mild, moderate, or severe)     3/10 2. FREQUENCY: How many times have you had painful urination today?      Happens without urinating. 3. PATTERN: Is pain present every time you urinate or just sometimes?      Pain just happens. 4. ONSET: When did the painful urination start?      Couple weeks ago 5. FEVER: Do you have a fever? If Yes, ask: What is your temperature, how was it measured, and when did it start?     no 6. PAST UTI: Have you had a urine infection before? If Yes, ask: When was the last time? and What happened that time?      na 7. CAUSE: What do you think is causing the painful urination?      No pain with urination but pain in the area 8. OTHER SYMPTOMS: Do you have any other symptoms? (e.g., flank pain, penis discharge, scrotal pain, blood in urine)     denies  Protocols used: Urination Pain - Male-A-AH

## 2023-10-29 NOTE — Progress Notes (Signed)
 Subjective:    Patient ID: Mark Francis, male    DOB: 11-04-1975, 48 y.o.   MRN: [id number]  Dysuria    Approximately 1 week ago, the patient developed dysuria.  Mark Francis reports of burning pain within his urethra whenever Mark Francis urinates.  The pain will come back even when Mark Francis sitting and doing nothing.  Mark Francis was briefly started on Bactrim.  His symptoms improved a little bit but they are still present.  Mark Francis reports that Mark Francis is also dribbling after urination.  Mark Francis does not feel that Mark Francis can empty his bladder completely.  Mark Francis complains of pain and discomfort within the rectum.  Mark Francis denies any hematuria.  Mark Francis denies any testicular pain.,  There is no testicular mass.  There is no hernia.  There is no inguinal lymphadenopathy. Past Medical History:  Diagnosis Date   Allergy    Cancer (HCC)    basal cell   Past Surgical History:  Procedure Laterality Date   INGUINAL HERNIA REPAIR     MYRINGOTOMY     TONSILLECTOMY     Current Outpatient Medications on File Prior to Visit  Medication Sig Dispense Refill   ibuprofen (ADVIL,MOTRIN) 200 MG tablet Take 200 mg by mouth every 6 (six) hours as needed.     acetaminophen (TYLENOL) 500 MG tablet Take 500 mg by mouth every 6 (six) hours as needed. (Patient not taking: Reported on 06/04/2023)     sodium chloride (OCEAN) 0.65 % SOLN nasal spray Place 1 spray into both nostrils as needed for congestion. (Patient not taking: Reported on 06/04/2023)     No current facility-administered medications on file prior to visit.   Allergies  Allergen Reactions   Benzonatate Hives   Dextromethorphan Hbr Hives   Influenza Vaccines Other (See Comments)    Pt reports feeling horrible after receiving vaccine   Social History   Socioeconomic History   Marital status: Married    Spouse name: Not on file   Number of children: Not on file   Years of education: Not on file   Highest education level: Not on file  Occupational History   Not on file  Tobacco Use   Smoking status:  Never   Smokeless tobacco: Never  Vaping Use   Vaping status: Never Used  Substance and Sexual Activity   Alcohol use: Yes    Comment: occasional   Drug use: No   Sexual activity: Yes  Other Topics Concern   Not on file  Social History Narrative   Not on file   Social Drivers of Health   Financial Resource Strain: Not on file  Food Insecurity: Not on file  Transportation Needs: Not on file  Physical Activity: Not on file  Stress: Not on file  Social Connections: Not on file  Intimate Partner Violence: Not on file   Family History  Problem Relation Age of Onset   Heart disease Mother        pericarditis   Thalassemia Mother    Cancer Father        bladder cancer, melanoma   Thalassemia Brother    Cancer Paternal Uncle        testicular   Dementia Maternal Grandfather    Cancer Paternal Grandfather        lyphoma   Diabetes Neg Hx    Early death Neg Hx    Hypertension Neg Hx    Hyperlipidemia Neg Hx    Stroke Neg Hx      Review  of Systems  Genitourinary:  Positive for dysuria.  All other systems reviewed and are negative.      Objective:   Physical Exam Vitals reviewed.  Constitutional:      General: Mark Francis is not in acute distress.    Appearance: Normal appearance. Mark Francis is normal weight. Mark Francis is not ill-appearing, toxic-appearing or diaphoretic.  HENT:     Head: Normocephalic and atraumatic.  Eyes:     General: No scleral icterus. Cardiovascular:     Rate and Rhythm: Normal rate and regular rhythm.     Pulses: Normal pulses.     Heart sounds: Normal heart sounds. No murmur heard.    No friction rub. No gallop.  Pulmonary:     Effort: Pulmonary effort is normal. No respiratory distress.     Breath sounds: Normal breath sounds. No stridor. No wheezing, rhonchi or rales.  Chest:     Chest wall: No tenderness.  Abdominal:     General: Abdomen is flat. Bowel sounds are normal. There is no distension.     Palpations: Abdomen is soft. There is no mass.      Tenderness: There is no abdominal tenderness. There is no guarding or rebound.     Hernia: No hernia is present.  Genitourinary:    Penis: Normal.      Testes: Normal.  Musculoskeletal:     Right lower leg: No edema.     Left lower leg: No edema.  Skin:    General: Skin is warm.     Coloration: Skin is not jaundiced.     Findings: Lesion present. No bruising or rash.  Neurological:     General: No focal deficit present.     Mental Status: Mark Francis is alert and oriented to person, place, and time.     Cranial Nerves: No cranial nerve deficit.     Motor: No weakness.     Gait: Gait normal.     Deep Tendon Reflexes: Reflexes normal.  Psychiatric:        Mood and Affect: Mood normal.        Behavior: Behavior normal.        Thought Content: Thought content normal.        Judgment: Judgment normal.          Assessment & Plan:   Dysuria - Plan: Urinalysis, Routine w reflex microscopic  Urinalysis today is unremarkable.  I am concerned the patient may have a resistant pathogen partially treated with Bactrim or possibly prostatitis given the incomplete evacuation and the postvoid dribbling that Mark Francis experiences after micturation.  We will try switching the patient to Cipro  500 mg twice daily for 10 days.  I will send the patients urine for culture.  We will also add Flomax  0.4 mg p.o. daily to relax his urinary sphincter to allow more complete evacuation of urine and to reduce postvoid dribbling.

## 2023-10-30 LAB — URINE CULTURE
MICRO NUMBER:: 16884240
Result:: NO GROWTH
SPECIMEN QUALITY:: ADEQUATE

## 2023-10-31 ENCOUNTER — Ambulatory Visit: Payer: Self-pay | Admitting: Family Medicine

## 2024-02-11 DIAGNOSIS — D224 Melanocytic nevi of scalp and neck: Secondary | ICD-10-CM | POA: Diagnosis not present

## 2024-03-16 ENCOUNTER — Ambulatory Visit: Admitting: Family Medicine

## 2024-03-16 ENCOUNTER — Encounter: Payer: Self-pay | Admitting: Family Medicine

## 2024-03-16 ENCOUNTER — Ambulatory Visit: Payer: Self-pay

## 2024-03-16 VITALS — BP 118/80 | HR 77 | Ht 70.0 in | Wt 212.8 lb

## 2024-03-16 DIAGNOSIS — R399 Unspecified symptoms and signs involving the genitourinary system: Secondary | ICD-10-CM | POA: Diagnosis not present

## 2024-03-16 DIAGNOSIS — R350 Frequency of micturition: Secondary | ICD-10-CM

## 2024-03-16 DIAGNOSIS — N401 Enlarged prostate with lower urinary tract symptoms: Secondary | ICD-10-CM | POA: Diagnosis not present

## 2024-03-16 LAB — URINALYSIS, ROUTINE W REFLEX MICROSCOPIC
Bilirubin Urine: NEGATIVE
Glucose, UA: NEGATIVE
Hgb urine dipstick: NEGATIVE
Ketones, ur: NEGATIVE
Leukocytes,Ua: NEGATIVE
Nitrite: NEGATIVE
Protein, ur: NEGATIVE
Specific Gravity, Urine: 1.02 (ref 1.001–1.035)
pH: 6 (ref 5.0–8.0)

## 2024-03-16 MED ORDER — CIPROFLOXACIN HCL 500 MG PO TABS: 500.0000 mg | ORAL_TABLET | Freq: Two times a day (BID) | ORAL | 0 refills | Status: AC

## 2024-03-16 MED ORDER — TAMSULOSIN HCL 0.4 MG PO CAPS: 0.4000 mg | ORAL_CAPSULE | Freq: Every day | ORAL | 1 refills | Status: AC

## 2024-03-16 NOTE — Telephone Encounter (Signed)
 FYI Only or Action Required?: FYI only for provider: appointment scheduled on 1/12.  Patient was last seen in primary care on 10/29/2023 by Duanne Butler DASEN, MD.  Called Nurse Triage reporting Urinary Frequency.  Symptoms began several days ago.  Interventions attempted: Rest, hydration, or home remedies.  Symptoms are: gradually worsening.  Triage Disposition: See Physician Within 24 Hours  Patient/caregiver understands and will follow disposition?: Yes  Copied from CRM #[id number]. Topic: Clinical - Medical Advice >> Mar 16, 2024  8:24 AM Tiffany B wrote: Reason for CRM: Frequent urination, possible UTI, same symptoms he was experiencing on 10/29/2023 (reference OV notes). Patient would like PCP to send a prescription or advise if an appointment is needed.    [facility] - [place], [place] - [address]  Phone: [phone] Fax: [phone] >> Mar 16, 2024  8:30 AM Tiffany B wrote: Note: Used the wrong resolved. Appointment was not scheduled. Patient requesting a follow up call from NT.  Reason for Disposition  Urinating more frequently than usual (i.e., frequency) OR new-onset of the feeling of an urgent need to urinate (i.e., urgency)  Answer Assessment - Initial Assessment Questions 3-4days of increased urinary frequency, Dribbling after- feeling of incomplete emptying, abdomen uncomfortable around belt buckle- worse when not voiding. Feels somewhat raw when voiding. Denies burning with urination, fever, flank pain, or blood in urine.    Same symptoms in August- placed on Flomax  (30days) and ABX.- flomax  helped. Concern for prostatitis due to clean UA at the time.   Appt with PCP office this afternoon to check-    1. SYMPTOM: What's the main symptom you're concerned about? (e.g., frequency, incontinence)     Increased frequency, feeling on incomplete emptying,  2. ONSET: When did the  frequency  start?     3-4 days  3. PAIN: Is there any pain? If Yes, ask:  How bad is it? (Scale: 1-10; mild, moderate, severe)     2-3/10 uncomfortable- lower abdominal 4. CAUSE: What do you think is causing the symptoms?     UTI  5. OTHER SYMPTOMS: Do you have any other symptoms? (e.g., blood in urine, fever, flank pain, pain with urination)     Denies fever, blood in urine, flank pain  Protocols used: Urinary Symptoms-A-AH

## 2024-03-16 NOTE — Progress Notes (Signed)
 "  Patient Office Visit  Assessment & Plan:  Lower urinary tract symptoms (LUTS) -     Urinalysis, Routine w reflex microscopic -     Urine Culture -     Ciprofloxacin  HCl; Take 1 tablet (500 mg total) by mouth 2 (two) times daily for 10 days.  Dispense: 20 tablet; Refill: 0 -     Tamsulosin  HCl; Take 1 capsule (0.4 mg total) by mouth daily.  Dispense: 30 capsule; Refill: 1  Urinary frequency -     Ciprofloxacin  HCl; Take 1 tablet (500 mg total) by mouth 2 (two) times daily for 10 days.  Dispense: 20 tablet; Refill: 0 -     Tamsulosin  HCl; Take 1 capsule (0.4 mg total) by mouth daily.  Dispense: 30 capsule; Refill: 1  Benign prostatic hyperplasia with urinary frequency   Assessment and Plan    Benign prostatic hyperplasia with lower urinary tract symptoms Chronic BPH with recurrent urinary symptoms. Symptoms previously improved with Flomax  and Cipro . Mild infection suspected despite negative urinalysis and culture. - Prescribed Flomax  for 60 days. - Prescribed Cipro  for potential infection. - Ordered urine culture. - Provided Flomax  refill.          Return if symptoms worsen or fail to improve.   Subjective:    Patient ID: Mark Francis, male    DOB: May 29, 1975  Age: 49 y.o. MRN: [id number]  Chief Complaint  Patient presents with   Urinary Frequency    Pt states he feels minor body aches, but no pain. He states he feels like he has to go to the bathroom a lot.    HPI Discussed the use of AI scribe software for clinical note transcription with the patient, who gave verbal consent to proceed.  History of Present Illness       History of Present Illness Mark Francis is a 49 year old male with an enlarged prostate who presents with urinary frequency and urgency.  He experiences urinary frequency and urgency, urinating two to three times from morning until lunchtime. Despite recent urination, he often feels the need to go again. At night, while lying in bed, he senses  the urge to urinate but does not experience incontinence or bedwetting. Occasionally, he notices 'extra dribbles' after urination.  He has a history of an enlarged prostate and was treated with Flomax  and Cipro  in August, which resolved his symptoms by the end of the treatment period. He took Cipro  for ten days and Flomax  for thirty days, alleviating his symptoms for about five months. Currently, he has no burning sensation, although he experienced a raw feeling in August. Patient not having fever or chills.   He avoids drinking fluids after 8:30 PM to minimize nighttime urination and typically goes to bed around 10:30 or 11:00 PM. He drinks mostly water and black coffee, with minimal intake of soft drinks and alcohol, which he avoids to help control his weight and prevent sleep disturbances.  He recalls a past adverse reaction to a flu shot about eight to ten years ago, which led to symptoms resembling multiple sclerosis, including numbness on the left side of his face and issues with eye convergence. Extensive workup at the time revealed inflammation as the cause. He has since avoided flu shots.  Physical Exam HEENT: Scarring in ears.  Results Labs UA (10/2023): Within normal limits Urine culture (10/2023): Negative for bacterial growth  Assessment and Plan Benign prostatic hyperplasia with lower urinary tract symptoms Chronic BPH with recurrent urinary  symptoms. Symptoms previously improved with Flomax  and Cipro . Mild infection suspected despite negative urinalysis and culture. - Prescribed Flomax  for 60 days. - Prescribed Cipro  for potential infection. - Ordered urine culture. - Provided Flomax  refill.    The ASCVD Risk score (Arnett DK, et al., 2019) failed to calculate for the following reasons:   Unable to determine if patient is Non-Hispanic African American  Past Medical History:  Diagnosis Date   Allergy    Cancer (HCC)    basal cell   Past Surgical History:  Procedure  Laterality Date   INGUINAL HERNIA REPAIR     MYRINGOTOMY     TONSILLECTOMY     Social History[1] Family History  Problem Relation Age of Onset   Heart disease Mother        pericarditis   Thalassemia Mother    Cancer Father        bladder cancer, melanoma   Thalassemia Brother    Cancer Paternal Uncle        testicular   Dementia Maternal Grandfather    Cancer Paternal Grandfather        lyphoma   Diabetes Neg Hx    Early death Neg Hx    Hypertension Neg Hx    Hyperlipidemia Neg Hx    Stroke Neg Hx    Allergies[2]  ROS    Objective:    BP 118/80 (BP Location: Left Arm, Patient Position: Sitting, Cuff Size: Normal)   Pulse 77   Ht 5' 10 (1.778 m)   Wt 212 lb 12.8 oz (96.5 kg)   SpO2 97%   BMI 30.53 kg/m  BP Readings from Last 3 Encounters:  03/16/24 118/80  10/29/23 136/82  06/04/23 122/82   Wt Readings from Last 3 Encounters:  03/16/24 212 lb 12.8 oz (96.5 kg)  10/29/23 211 lb 6.4 oz (95.9 kg)  06/04/23 210 lb 12.8 oz (95.6 kg)    Physical Exam Constitutional:      General: He is not in acute distress. HENT:     Head: Normocephalic.     Right Ear: There is no impacted cerumen.     Left Ear: There is no impacted cerumen.     Ears:     Comments: TM with scarring noted Cardiovascular:     Rate and Rhythm: Normal rate.  Pulmonary:     Breath sounds: No wheezing or rhonchi.  Genitourinary:    Comments: GU- deferred Neurological:     General: No focal deficit present.     Mental Status: He is oriented to person, place, and time.      Results for orders placed or performed in visit on 03/16/24  Urinalysis, Routine w reflex microscopic  Result Value Ref Range   Color, Urine YELLOW YELLOW   APPearance CLEAR CLEAR   Specific Gravity, Urine 1.020 1.001 - 1.035   pH 6.0 5.0 - 8.0   Glucose, UA NEGATIVE NEGATIVE   Bilirubin Urine NEGATIVE NEGATIVE   Ketones, ur NEGATIVE NEGATIVE   Hgb urine dipstick NEGATIVE NEGATIVE   Protein, ur NEGATIVE  NEGATIVE   Nitrite NEGATIVE NEGATIVE   Leukocytes,Ua NEGATIVE NEGATIVE            [1]  Social History Tobacco Use   Smoking status: Never   Smokeless tobacco: Never  Vaping Use   Vaping status: Never Used  Substance Use Topics   Alcohol use: Yes    Comment: occasional   Drug use: No  [2]  Allergies Allergen Reactions   Benzonatate  Hives   Dextromethorphan Hbr Hives   Influenza Vaccines Other (See Comments)    Pt reports feeling horrible after receiving vaccine   "

## 2024-03-17 LAB — URINE CULTURE
MICRO NUMBER:: 17456384
Result:: NO GROWTH
SPECIMEN QUALITY:: ADEQUATE

## 2024-03-18 ENCOUNTER — Ambulatory Visit: Payer: Self-pay | Admitting: Family Medicine
